# Patient Record
Sex: Female | Born: 1970
Health system: Southern US, Community
[De-identification: ages and names within clinical notes are randomized; demographics above are authoritative.]

## PROBLEM LIST (undated history)

## (undated) DIAGNOSIS — K219 Gastro-esophageal reflux disease without esophagitis: Secondary | ICD-10-CM

## (undated) DIAGNOSIS — C541 Malignant neoplasm of endometrium: Secondary | ICD-10-CM

## (undated) DIAGNOSIS — R945 Abnormal results of liver function studies: Secondary | ICD-10-CM

## (undated) DIAGNOSIS — E079 Disorder of thyroid, unspecified: Secondary | ICD-10-CM

## (undated) DIAGNOSIS — N183 Chronic kidney disease, stage 3 unspecified: Secondary | ICD-10-CM

## (undated) DIAGNOSIS — E559 Vitamin D deficiency, unspecified: Secondary | ICD-10-CM

## (undated) DIAGNOSIS — I10 Essential (primary) hypertension: Secondary | ICD-10-CM

## (undated) DIAGNOSIS — M2550 Pain in unspecified joint: Secondary | ICD-10-CM

## (undated) DIAGNOSIS — F32A Depression, unspecified: Secondary | ICD-10-CM

## (undated) DIAGNOSIS — T451X5A Adverse effect of antineoplastic and immunosuppressive drugs, initial encounter: Secondary | ICD-10-CM

## (undated) DIAGNOSIS — G620 Drug-induced polyneuropathy: Secondary | ICD-10-CM

## (undated) DIAGNOSIS — R7989 Other specified abnormal findings of blood chemistry: Secondary | ICD-10-CM

## (undated) DIAGNOSIS — F329 Major depressive disorder, single episode, unspecified: Secondary | ICD-10-CM

## (undated) DIAGNOSIS — E538 Deficiency of other specified B group vitamins: Secondary | ICD-10-CM

## (undated) DIAGNOSIS — S0300XA Dislocation of jaw, unspecified side, initial encounter: Secondary | ICD-10-CM

## (undated) HISTORY — PX: NEPHRECTOMY: SHX65

## (undated) HISTORY — PX: FRACTURE SURGERY: SHX138

## (undated) HISTORY — PX: CHOLECYSTECTOMY: SHX55

---

## 2015-11-11 ENCOUNTER — Emergency Department: Payer: 59

## 2015-11-11 ENCOUNTER — Observation Stay
Admission: EM | Admit: 2015-11-11 | Discharge: 2015-11-12 | Disposition: A | Discharge status: Home / Self Care | Payer: 59 | Attending: Internal Medicine | Admitting: Internal Medicine

## 2015-11-11 ENCOUNTER — Encounter: Payer: Self-pay | Admitting: Urgent Care

## 2015-11-11 DIAGNOSIS — J029 Acute pharyngitis, unspecified: Secondary | ICD-10-CM

## 2015-11-11 DIAGNOSIS — K219 Gastro-esophageal reflux disease without esophagitis: Secondary | ICD-10-CM | POA: Insufficient documentation

## 2015-11-11 DIAGNOSIS — I129 Hypertensive chronic kidney disease with stage 1 through stage 4 chronic kidney disease, or unspecified chronic kidney disease: Secondary | ICD-10-CM | POA: Insufficient documentation

## 2015-11-11 DIAGNOSIS — Z6841 Body Mass Index (BMI) 40.0 and over, adult: Secondary | ICD-10-CM | POA: Diagnosis not present

## 2015-11-11 DIAGNOSIS — N183 Chronic kidney disease, stage 3 (moderate): Secondary | ICD-10-CM | POA: Insufficient documentation

## 2015-11-11 DIAGNOSIS — Z79899 Other long term (current) drug therapy: Secondary | ICD-10-CM | POA: Insufficient documentation

## 2015-11-11 DIAGNOSIS — Z8542 Personal history of malignant neoplasm of other parts of uterus: Secondary | ICD-10-CM | POA: Diagnosis not present

## 2015-11-11 DIAGNOSIS — J039 Acute tonsillitis, unspecified: Principal | ICD-10-CM | POA: Insufficient documentation

## 2015-11-11 DIAGNOSIS — Z792 Long term (current) use of antibiotics: Secondary | ICD-10-CM | POA: Insufficient documentation

## 2015-11-11 DIAGNOSIS — F329 Major depressive disorder, single episode, unspecified: Secondary | ICD-10-CM | POA: Diagnosis not present

## 2015-11-11 DIAGNOSIS — E559 Vitamin D deficiency, unspecified: Secondary | ICD-10-CM | POA: Diagnosis not present

## 2015-11-11 DIAGNOSIS — E039 Hypothyroidism, unspecified: Secondary | ICD-10-CM | POA: Diagnosis not present

## 2015-11-11 DIAGNOSIS — J36 Peritonsillar abscess: Secondary | ICD-10-CM | POA: Diagnosis present

## 2015-11-11 HISTORY — DX: Drug-induced polyneuropathy: T45.1X5A

## 2015-11-11 HISTORY — DX: Other specified abnormal findings of blood chemistry: R79.89

## 2015-11-11 HISTORY — DX: Major depressive disorder, single episode, unspecified: F32.9

## 2015-11-11 HISTORY — DX: Disorder of thyroid, unspecified: E07.9

## 2015-11-11 HISTORY — DX: Essential (primary) hypertension: I10

## 2015-11-11 HISTORY — DX: Gastro-esophageal reflux disease without esophagitis: K21.9

## 2015-11-11 HISTORY — DX: Deficiency of other specified B group vitamins: E53.8

## 2015-11-11 HISTORY — DX: Pain in unspecified joint: M25.50

## 2015-11-11 HISTORY — DX: Drug-induced polyneuropathy: G62.0

## 2015-11-11 HISTORY — DX: Chronic kidney disease, stage 3 unspecified: N18.30

## 2015-11-11 HISTORY — DX: Morbid (severe) obesity due to excess calories: E66.01

## 2015-11-11 HISTORY — DX: Dislocation of jaw, unspecified side, initial encounter: S03.00XA

## 2015-11-11 HISTORY — DX: Vitamin D deficiency, unspecified: E55.9

## 2015-11-11 HISTORY — DX: Chronic kidney disease, stage 3 (moderate): N18.3

## 2015-11-11 HISTORY — DX: Malignant neoplasm of endometrium: C54.1

## 2015-11-11 HISTORY — DX: Abnormal results of liver function studies: R94.5

## 2015-11-11 HISTORY — DX: Depression, unspecified: F32.A

## 2015-11-11 LAB — BASIC METABOLIC PANEL
Anion gap: 9 (ref 5–15)
BUN: 10 mg/dL (ref 6–20)
CHLORIDE: 101 mmol/L (ref 101–111)
CO2: 29 mmol/L (ref 22–32)
Calcium: 9.2 mg/dL (ref 8.9–10.3)
Creatinine, Ser: 1.12 mg/dL — ABNORMAL HIGH (ref 0.44–1.00)
GFR calc non Af Amer: 59 mL/min — ABNORMAL LOW (ref >60)
Glucose, Bld: 112 mg/dL — ABNORMAL HIGH (ref 65–99)
POTASSIUM: 4.4 mmol/L (ref 3.5–5.1)
SODIUM: 139 mmol/L (ref 135–145)

## 2015-11-11 LAB — CBC WITH DIFFERENTIAL/PLATELET
BASOS PCT: 1 %
Basophils Absolute: 0.1 10*3/uL (ref 0–0.1)
EOS ABS: 0.3 10*3/uL (ref 0–0.7)
Eosinophils Relative: 3 %
HCT: 40.8 % (ref 35.0–47.0)
HEMOGLOBIN: 13.3 g/dL (ref 12.0–16.0)
LYMPHS ABS: 1.3 10*3/uL (ref 1.0–3.6)
Lymphocytes Relative: 16 %
MCH: 29.1 pg (ref 26.0–34.0)
MCHC: 32.6 g/dL (ref 32.0–36.0)
MCV: 89.3 fL (ref 80.0–100.0)
Monocytes Absolute: 0.7 10*3/uL (ref 0.2–0.9)
Monocytes Relative: 8 %
NEUTROS PCT: 72 %
Neutro Abs: 6.1 10*3/uL (ref 1.4–6.5)
Platelets: 249 10*3/uL (ref 150–440)
RBC: 4.57 MIL/uL (ref 3.80–5.20)
RDW: 14.6 % — ABNORMAL HIGH (ref 11.5–14.5)
WBC: 8.4 10*3/uL (ref 3.6–11.0)

## 2015-11-11 LAB — POCT RAPID STREP A: STREPTOCOCCUS, GROUP A SCREEN (DIRECT): NEGATIVE

## 2015-11-11 MED ORDER — MORPHINE SULFATE (PF) 2 MG/ML IV SOLN: 2.0000 mg | INTRAVENOUS | Status: DC | PRN

## 2015-11-11 MED ORDER — AMPICILLIN-SULBACTAM SODIUM 3 (2-1) G IJ SOLR
3.0000 g | Freq: Once | INTRAMUSCULAR | Status: AC
  Administered 2015-11-11: 3 g via INTRAVENOUS
  Filled 2015-11-11: qty 3

## 2015-11-11 MED ORDER — ALBUTEROL SULFATE (2.5 MG/3ML) 0.083% IN NEBU: 3.0000 mL | INHALATION_SOLUTION | Freq: Four times a day (QID) | RESPIRATORY_TRACT | Status: DC | PRN

## 2015-11-11 MED ORDER — LOSARTAN POTASSIUM 50 MG PO TABS
50.0000 mg | ORAL_TABLET | Freq: Every day | ORAL | Status: DC
  Administered 2015-11-11 – 2015-11-12 (×2): 50 mg via ORAL
  Filled 2015-11-11 (×2): qty 1

## 2015-11-11 MED ORDER — ACETAMINOPHEN 650 MG RE SUPP: 650.0000 mg | Freq: Four times a day (QID) | RECTAL | Status: DC | PRN

## 2015-11-11 MED ORDER — DEXAMETHASONE SODIUM PHOSPHATE 4 MG/ML IJ SOLN
4.0000 mg | Freq: Four times a day (QID) | INTRAMUSCULAR | Status: DC
  Administered 2015-11-11 – 2015-11-12 (×4): 4 mg via INTRAVENOUS
  Filled 2015-11-11 (×6): qty 1

## 2015-11-11 MED ORDER — IOPAMIDOL (ISOVUE-300) INJECTION 61%
75.0000 mL | Freq: Once | INTRAVENOUS | Status: AC | PRN
  Administered 2015-11-11: 75 mL via INTRAVENOUS

## 2015-11-11 MED ORDER — ACETAMINOPHEN 500 MG PO TABS
1000.0000 mg | ORAL_TABLET | Freq: Once | ORAL | Status: AC
  Administered 2015-11-11: 1000 mg via ORAL
  Filled 2015-11-11: qty 2

## 2015-11-11 MED ORDER — IBUPROFEN 400 MG PO TABS
400.0000 mg | ORAL_TABLET | Freq: Four times a day (QID) | ORAL | Status: DC | PRN
  Administered 2015-11-11 – 2015-11-12 (×3): 400 mg via ORAL
  Filled 2015-11-11 (×3): qty 1

## 2015-11-11 MED ORDER — LEVOTHYROXINE SODIUM 75 MCG PO TABS
75.0000 ug | ORAL_TABLET | Freq: Every day | ORAL | Status: DC
  Administered 2015-11-11 – 2015-11-12 (×2): 75 ug via ORAL
  Filled 2015-11-11 (×2): qty 1

## 2015-11-11 MED ORDER — ONDANSETRON HCL 4 MG PO TABS: 4.0000 mg | ORAL_TABLET | Freq: Four times a day (QID) | ORAL | Status: DC | PRN

## 2015-11-11 MED ORDER — BUPROPION HCL ER (XL) 150 MG PO TB24
150.0000 mg | ORAL_TABLET | Freq: Two times a day (BID) | ORAL | Status: DC
Start: 2015-11-11 — End: 2015-11-12
  Administered 2015-11-11 – 2015-11-12 (×3): 150 mg via ORAL
  Filled 2015-11-11 (×4): qty 1

## 2015-11-11 MED ORDER — SENNOSIDES-DOCUSATE SODIUM 8.6-50 MG PO TABS: 1.0000 | ORAL_TABLET | Freq: Every evening | ORAL | Status: DC | PRN

## 2015-11-11 MED ORDER — DEXAMETHASONE SODIUM PHOSPHATE 10 MG/ML IJ SOLN
10.0000 mg | Freq: Once | INTRAMUSCULAR | Status: AC
  Administered 2015-11-11: 10 mg via INTRAVENOUS
  Filled 2015-11-11: qty 1

## 2015-11-11 MED ORDER — ACETAMINOPHEN 325 MG PO TABS
650.0000 mg | ORAL_TABLET | Freq: Four times a day (QID) | ORAL | Status: DC | PRN
  Administered 2015-11-11 (×2): 650 mg via ORAL
  Filled 2015-11-11 (×2): qty 2

## 2015-11-11 MED ORDER — ONDANSETRON HCL 4 MG/2ML IJ SOLN: 4.0000 mg | Freq: Four times a day (QID) | INTRAMUSCULAR | Status: DC | PRN

## 2015-11-11 MED ORDER — SODIUM CHLORIDE 0.9 % IV SOLN
3.0000 g | Freq: Four times a day (QID) | INTRAVENOUS | Status: DC
  Administered 2015-11-11 – 2015-11-12 (×4): 3 g via INTRAVENOUS
  Filled 2015-11-11 (×8): qty 3

## 2015-11-11 MED ORDER — ENOXAPARIN SODIUM 40 MG/0.4ML ~~LOC~~ SOLN
40.0000 mg | Freq: Two times a day (BID) | SUBCUTANEOUS | Status: DC
  Filled 2015-11-11 (×2): qty 0.4

## 2015-11-11 MED ORDER — SODIUM CHLORIDE 0.9 % IV SOLN
INTRAVENOUS | Status: DC
  Administered 2015-11-11 (×2): via INTRAVENOUS

## 2015-11-11 MED ORDER — SODIUM CHLORIDE 0.9 % IV BOLUS (SEPSIS)
500.0000 mL | Freq: Once | INTRAVENOUS | Status: AC
  Administered 2015-11-11: 500 mL via INTRAVENOUS

## 2015-11-11 NOTE — Clinical Social Work Note (Signed)
CSW received consult that patient has no support. CSW visited patient to assess for social supports and offer emotional support. The patient reported that her sister has limited the family involvement since the death of their father 2 years ago. The patient relayed episodes during which her sister would deny transportation to the patient for medical needs and during which the sister would forbid other family members from assisting. The CSW provided emotional support in the form of empathy, active listening, and motivational interviewing. The CSW provided a resource list for [facility] in order to empower the patient to build community support post-dc. The patient thanked the CSW and indicated that she may need assistance with transport if a friend cannot do so at dc. The CSW advised the patient that the primary CSW would be aware and would assist at the time if needed.   Santiago Bumpers, MSW, LCSW-A [phone]

## 2015-11-11 NOTE — ED Notes (Signed)
Patient transported to CT 

## 2015-11-11 NOTE — H&P (Signed)
[place] [facility] [place] NAME: Laura Nolan    MR#:  [id number]  DATE OF BIRTH:  02-14-1970  DATE OF ADMISSION:  11/11/2015  PRIMARY CARE PHYSICIAN: No primary care provider on file.   REQUESTING/REFERRING PHYSICIAN:   CHIEF COMPLAINT:   Chief Complaint  Patient presents with  . Sore Throat  . Abscess    HISTORY OF PRESENT ILLNESS: Laura Nolan  is a 45 y.o. female with a known history of GERD, hypertension, thyroid disease, vitamin B-12 deficiency, vitamin D deficiency presented to the emergency room with sore throat since one week. Patient has pain in the throat which is a burning and aching in nature 7 out of 10 on a scale of 1-10. She also had a low-grade fever couple of days ago. Has difficulty swallowing solid food secondary to pain in the throat. No history of travel or sick contacts at home. Patient had a tonsillar inflammation number of times and she was a child. Patient was evaluated in the emergency room she was found to have a tonsillar enlargement and tonsillar abscess on CT scan of the neck. Case was discussed with ENT physician on-call who recommended inpatient hospitalization and IV antibiotics. No complaints of any chest pain. No history of any shortness of breath. Hospitalist service was consulted for further care of the patient.  PAST MEDICAL HISTORY:   Past Medical History:  Diagnosis Date  . Arthralgia   . Chronic renal disease, stage 3, moderately decreased glomerular filtration rate (GFR) between 30-59 mL/min/1.73 square meter   . Depression   . Elevated LFTs   . Endometrial cancer ([place])   . Endometrial cancer ([place])   . GERD (gastroesophageal reflux disease)   . Hypertension   . Morbid obesity ([place])   . Neuropathy due to chemotherapeutic drug ([place])   . Thyroid disease   . TMJ (dislocation of temporomandibular joint)   . Vitamin B12 deficiency   . Vitamin D deficiency     PAST SURGICAL HISTORY: Past Surgical  History:  Procedure Laterality Date  . CHOLECYSTECTOMY    . FRACTURE SURGERY    . NEPHRECTOMY Right     SOCIAL HISTORY:  Social History  Substance Use Topics  . Smoking status: Never Smoker  . Smokeless tobacco: Never Used  . Alcohol use Yes    FAMILY HISTORY:  Family History  Problem Relation Age of Onset  . Hypertension Mother   . Diabetes Father   . Hypertension Father     DRUG ALLERGIES:  Allergies  Allergen Reactions  . Lisinopril Cough  . Macrodantin [Nitrofurantoin Macrocrystal] Itching  . Sulfa Antibiotics Itching and Swelling    REVIEW OF SYSTEMS:   CONSTITUTIONAL: Had fever No fatigue or weakness.  EYES: No blurred or double vision.  EARS, NOSE, AND THROAT: No tinnitus or ear pain.  Has throat pain. RESPIRATORY: No cough, shortness of breath, wheezing or hemoptysis.  CARDIOVASCULAR: No chest pain, orthopnea, edema.  GASTROINTESTINAL: No nausea, vomiting, diarrhea or abdominal pain.  GENITOURINARY: No dysuria, hematuria.  ENDOCRINE: No polyuria, nocturia,  HEMATOLOGY: No anemia, easy bruising or bleeding SKIN: No rash or lesion. MUSCULOSKELETAL: No joint pain or arthritis.   NEUROLOGIC: No tingling, numbness, weakness.  PSYCHIATRY: No anxiety or depression.   MEDICATIONS AT HOME:  Prior to Admission medications   Medication Sig Start Date End Date Taking? Authorizing Provider  acetaminophen (TYLENOL) 500 MG tablet Take 1,000 mg by mouth every 6 (six) hours as needed for mild pain.  Yes Historical Provider, MD  albuterol (PROVENTIL HFA;VENTOLIN HFA) 108 (90 Base) MCG/ACT inhaler Inhale 2 puffs into the lungs every 6 (six) hours as needed for wheezing or shortness of breath.   Yes Historical Provider, MD  buPROPion (WELLBUTRIN XL) 150 MG 24 hr tablet Take 150 mg by mouth 2 (two) times daily.   Yes Historical Provider, MD  clindamycin (CLEOCIN) 300 MG capsule Take 300 mg by mouth 4 (four) times daily.   Yes Historical Provider, MD  diphenhydrAMINE  (BENADRYL) 25 MG tablet Take 25 mg by mouth every 6 (six) hours as needed for allergies.   Yes Historical Provider, MD  ergocalciferol (VITAMIN D2) 50000 units capsule Take 50,000 Units by mouth once a week. Patient takes on Wednesday.   Yes Historical Provider, MD  levothyroxine (SYNTHROID, LEVOTHROID) 75 MCG tablet Take 75 mcg by mouth daily before breakfast.   Yes Historical Provider, MD  loratadine (CLARITIN) 10 MG tablet Take 10 mg by mouth daily.   Yes Historical Provider, MD  LORazepam (ATIVAN) 1 MG tablet Take 1 mg by mouth 2 (two) times daily as needed for anxiety (nausea).   Yes Historical Provider, MD  losartan (COZAAR) 50 MG tablet Take 50 mg by mouth daily.   Yes Historical Provider, MD  montelukast (SINGULAIR) 10 MG tablet Take 10 mg by mouth at bedtime.   Yes Historical Provider, MD  pantoprazole (PROTONIX) 40 MG tablet Take 40 mg by mouth 2 (two) times daily.   Yes Historical Provider, MD      PHYSICAL EXAMINATION:   VITAL SIGNS: Blood pressure 126/67, pulse 80, temperature 98.3 F (36.8 C), temperature source Oral, resp. rate 20, height 5\' 6"  (1.676 m), weight (!) 213.2 kg (470 lb), SpO2 100 %.  GENERAL:  45 y.o.-year-old obese femalepatient lying in the bed with no acute distress.  EYES: Pupils equal, round, reactive to light and accommodation. No scleral icterus. Extraocular muscles intact.  HEENT: Head atraumatic, normocephalic. Oropharynx inflamed and erythematous Whitish exudate over tonsils Tonsils enlarged NECK:  Supple, no jugular venous distention. No thyroid enlargement, no tenderness.  LUNGS: Normal breath sounds bilaterally, no wheezing, rales,rhonchi or crepitation. No use of accessory muscles of respiration.  CARDIOVASCULAR: S1, S2 normal. No murmurs, rubs, or gallops.  ABDOMEN: Soft, obese, nontender, nondistended. Bowel sounds present. No organomegaly or mass.  EXTREMITIES: No pedal edema, cyanosis, or clubbing.  NEUROLOGIC: Cranial nerves II through XII are  intact. Muscle strength 5/5 in all extremities. Sensation intact. Gait not checked.  PSYCHIATRIC: The patient is alert and oriented x 3.  SKIN: No obvious rash, lesion, or ulcer.   LABORATORY PANEL:   CBC  Recent Labs Lab 11/11/15 0301  WBC 8.4  HGB 13.3  HCT 40.8  PLT 249  MCV 89.3  MCH 29.1  MCHC 32.6  RDW 14.6*  LYMPHSABS 1.3  MONOABS 0.7  EOSABS 0.3  BASOSABS 0.1   ------------------------------------------------------------------------------------------------------------------  Chemistries   Recent Labs Lab 11/11/15 0301  NA 139  K 4.4  CL 101  CO2 29  GLUCOSE 112*  BUN 10  CREATININE 1.12*  CALCIUM 9.2   ------------------------------------------------------------------------------------------------------------------ estimated creatinine clearance is 122.3 mL/min (by C-G formula based on SCr of 1.12 mg/dL (H)). ------------------------------------------------------------------------------------------------------------------ No results for input(s): TSH, T4TOTAL, T3FREE, THYROIDAB in the last 72 hours.  Invalid input(s): FREET3   Coagulation profile No results for input(s): INR, PROTIME in the last 168 hours. ------------------------------------------------------------------------------------------------------------------- No results for input(s): DDIMER in the last 72 hours. -------------------------------------------------------------------------------------------------------------------  Cardiac Enzymes No results for input(s):  CKMB, TROPONINI, MYOGLOBIN in the last 168 hours.  Invalid input(s): CK ------------------------------------------------------------------------------------------------------------------ Invalid input(s): POCBNP  ---------------------------------------------------------------------------------------------------------------  Urinalysis No results found for: COLORURINE, APPEARANCEUR, LABSPEC, PHURINE, GLUCOSEU, HGBUR,  BILIRUBINUR, KETONESUR, PROTEINUR, UROBILINOGEN, NITRITE, LEUKOCYTESUR   RADIOLOGY: Ct Soft Tissue Neck W Contrast  Result Date: 11/11/2015 CLINICAL DATA:  45 y/o F; pharyngitis with draining tonsils and concern for abscess. EXAM: CT NECK WITH CONTRAST TECHNIQUE: Multidetector CT imaging of the neck was performed using the standard protocol following the bolus administration of intravenous contrast. CONTRAST:  61mL ISOVUE-300 IOPAMIDOL (ISOVUE-300) INJECTION 61% COMPARISON:  None. FINDINGS: Pharynx and larynx: Massive enlargement of palatini tonsils contacting in the midline with multiple areas of hypoattenuation compatible with phlegmonous changes. There is a more discrete fluid collection in the left superior palatini tonsil measuring 11 x 11 x 7 mm (AP x ML x CC) (series 7, image 56 and series 6, image 56) which may represent a developing abscess. Inflammatory changes of the adjacent parapharyngeal fat is likely reactive. No prevertebral/retropharyngeal fluid collection. Salivary glands: No inflammation, mass, or stone. Thyroid: Normal. Lymph nodes: Bilateral upper cervical lymphadenopathy is likely reactive. The largest lymph node is in the left upper cervical chain and measures 27 x 25 mm axially. No lymph node necrosis. Vascular: Negative. Limited intracranial: Negative. Visualized orbits: Negative. Mastoids and visualized paranasal sinuses: Clear. Skeleton: Degenerative changes of the cervical spine with a prominent disc osteophyte complex at the C5-6 level with at least moderate canal stenosis. Upper chest: Negative. Other: None. IMPRESSION: 1. Massive palatini tonsil enlargement with multiple ill-defined areas of hypoattenuation compatible with phlegmonous changes. 2. More discrete left upper palatini tonsil fluid collection measuring up to 11 mm may represent an early abscess. 3. Mild reactive inflammatory changes of parapharyngeal fat and left-greater-than-right upper cervical lymphadenopathy. 4.  Degenerative changes of cervical spine with prominent disc osteophyte complex at C5-6 level with at least moderate canal stenosis. Electronically Signed   By: Kristine Garbe M.D.   On: 11/11/2015 04:14    EKG: No orders found for this or any previous visit.  IMPRESSION AND PLAN: 45 year old female patient with history of obesity, hypertension, endometrial cancer, GERD presented to the emergency room with a sore throat of one week duration. Admitting diagnosis 1. Tonsillar abscess 2. Tonsillitis 3. Sore throat secondary to tonsillar inflammation 4. Hypertension 5. GERD Treatment plan Admit patient to medical floor observation bed Pain management Clear liquid diet IV Unasyn antibiotic ENT consultation Supportive care.  All the records are reviewed and case discussed with ED provider. Management plans discussed with the patient, family and they are in agreement.  CODE STATUS:FULL Code Status History    This patient does not have a recorded code status. Please follow your organizational policy for patients in this situation.       TOTAL TIME TAKING CARE OF THIS PATIENT: 50 minutes.    Saundra Shelling M.D on 11/11/2015 at 5:58 AM  Between 7am to 6pm - Pager - [phone]  After 6pm go to [email or web address] - password [email or web address] [facility]  [place] [facility]  Office  [phone]  CC: Primary care physician; No primary care provider on file.

## 2015-11-11 NOTE — ED Notes (Signed)
Patient to stat desk via wheelchair by EMS.  Patient currently being treated for strep throat.  States worse now with drainage and tonsils touching.  EMS reports vital signs within normal limits.

## 2015-11-11 NOTE — ED Provider Notes (Signed)
[facility] Emergency Department Provider Note   ____________________________________________   First MD Initiated Contact with Patient 11/11/15 [phone]     (approximate)  I have reviewed the triage vital signs and the nursing notes.   HISTORY  Chief Complaint Sore Throat   HPI Laura Nolan is a 45 y.o. female with history of chronic kidney disease, hypertension and obesity who presents for evaluation of one week of sore throat, gradual onset, constant, severe, no modifying factors are she was seen by her primary care doctor on day 3 of illness and given an IM injection of penicillin. She reports her symptoms were not improving and 2 days ago she saw her primary care doctor who started her on by mouth clindamycin and recommended she present to the emergency department if her symptoms were worsening due to concern for developing abscess. Tonight she feels as if her swelling in her throat is worse. She was never tested for strep throat. She has had fevers him a chills. No chest pain or difficulty breathing. She had one episode of nonbloody nonbilious emesis but denies any diarrhea or abdominal pain.   Past Medical History:  Diagnosis Date  . Arthralgia   . Chronic renal disease, stage 3, moderately decreased glomerular filtration rate (GFR) between 30-59 mL/min/1.73 square meter   . Depression   . Elevated LFTs   . Endometrial cancer ([place])   . Endometrial cancer ([place])   . GERD (gastroesophageal reflux disease)   . Hypertension   . Morbid obesity ([place])   . Neuropathy due to chemotherapeutic drug ([place])   . Thyroid disease   . TMJ (dislocation of temporomandibular joint)   . Vitamin B12 deficiency   . Vitamin D deficiency     There are no active problems to display for this patient.   Past Surgical History:  Procedure Laterality Date  . CHOLECYSTECTOMY    . FRACTURE SURGERY    . NEPHRECTOMY Right     Prior to Admission medications   Not on File     Allergies Lisinopril; Macrodantin [nitrofurantoin macrocrystal]; and Sulfa antibiotics  No family history on file.  Social History Social History  Substance Use Topics  . Smoking status: Never Smoker  . Smokeless tobacco: Never Used  . Alcohol use Yes    Review of Systems Constitutional: + fever/chills Eyes: No visual changes. ENT: + sore throat. Cardiovascular: Denies chest pain. Respiratory: Denies shortness of breath. Gastrointestinal: No abdominal pain.  No nausea, no vomiting.  No diarrhea.  No constipation. Genitourinary: Negative for dysuria. Musculoskeletal: Negative for back pain. Skin: Negative for rash. Neurological: Negative for headaches, focal weakness or numbness.  10-point ROS otherwise negative.  ____________________________________________   PHYSICAL EXAM:  VITAL SIGNS: ED Triage Vitals  Enc Vitals Group     BP 11/11/15 0224 128/62     Pulse Rate 11/11/15 0224 86     Resp 11/11/15 0224 18     Temp 11/11/15 0224 98.3 F (36.8 C)     Temp Source 11/11/15 0224 Oral     SpO2 11/11/15 0224 99 %     Weight 11/11/15 0225 (!) 470 lb (213.2 kg)     Height 11/11/15 0225 5\' 6"  (1.676 m)     Head Circumference --      Peak Flow --      Pain Score 11/11/15 0225 8     Pain Loc --      Pain Edu? --      Excl. in [place]? --  Constitutional: Alert and oriented. Well appearing and in no acute distress. Eyes: Conjunctivae are normal. PERRL. EOMI. Head: Atraumatic. Nose: No congestion/rhinnorhea. Mouth/Throat: Mucous membranes are moist.  Oropharynx Is erythematous, there is white exudate on the tonsils bilaterally and the tonsils are enlarged bilaterally, left greater than right area of the uvula is midline. Neck: No stridor.Supple without meningismus.  Cardiovascular: Normal rate, regular rhythm. Grossly normal heart sounds.  Good peripheral circulation. Respiratory: Normal respiratory effort.  No retractions. Lungs CTAB. Gastrointestinal: Soft and  nontender. No distention. No abdominal bruits. No CVA tenderness. Genitourinary: deferred Musculoskeletal: No lower extremity tenderness nor edema.  No joint effusions. Neurologic:  Normal speech and language. No gross focal neurologic deficits are appreciated. No gait instability. Skin:  Skin is warm, dry and intact. No rash noted. Psychiatric: Mood and affect are normal. Speech and behavior are normal.  ____________________________________________   LABS (all labs ordered are listed, but only abnormal results are displayed)  Labs Reviewed  CBC WITH DIFFERENTIAL/PLATELET - Abnormal; Notable for the following:       Result Value   RDW 14.6 (*)    All other components within normal limits  BASIC METABOLIC PANEL - Abnormal; Notable for the following:    Glucose, Bld 112 (*)    Creatinine, Ser 1.12 (*)    GFR calc non Af Amer 59 (*)    All other components within normal limits  CULTURE, GROUP A STREP ([place])  CULTURE, BLOOD (ROUTINE X 2)  CULTURE, BLOOD (ROUTINE X 2)  POCT RAPID STREP A   ____________________________________________  EKG  none ____________________________________________  RADIOLOGY  CT soft tissue neck  IMPRESSION: 1. Massive palatini tonsil enlargement with multiple ill-defined areas of hypoattenuation compatible with phlegmonous changes. 2. More discrete left upper palatini tonsil fluid collection measuring up to 11 mm may represent an early abscess. 3. Mild reactive inflammatory changes of parapharyngeal fat and left-greater-than-right upper cervical lymphadenopathy. 4. Degenerative changes of cervical spine with prominent disc osteophyte complex at C5-6 level with at least moderate canal stenosis. ____________________________________________   PROCEDURES  Procedure(s) performed: None  Procedures  Critical Care performed: No  ____________________________________________   INITIAL IMPRESSION / ASSESSMENT AND PLAN / ED COURSE  Pertinent  labs & imaging results that were available during my care of the patient were reviewed by me and considered in my medical decision making (see chart for details).  Laura Nolan is a 45 y.o. female with history of chronic kidney disease, hypertension and obesity who presents for evaluation of one week of sore throat. On exam, she is nontoxic appearing and in no acute distress. Vital signs are stable and she is afebrile. She does have bilateral tonsillar enlargement as well as tonsillar exudate. She is handling secretions, there is no evidence of respiratory distress or impending airway compromise. We'll obtain screening labs, CT soft tissue neck to evaluate for peritonsillar abscess. Reassess for disposition.  ----------------------------------------- 5:16 AM on 11/11/2015 ----------------------------------------- CBC generally unremarkable. BMP shows mild creatinine elevation of 1.12. She put a care strep negative, culture sent. CT soft tissue neck shows supple area of phlegmonous change in the palatine tonsils as well as an 11 mm fluid collection in the left tonsil which could represent an early peritonsillar abscess. I discussed this with Dr. Malon Kindle of ENT who recommended hospitalist admission for IV antibiotics and steroids. He does not recommend attempt at drainage at this time. I have ordered IV Unasyn as well as Decadron. Case discussed with hospitalist for admission at this time.  Clinical Course     ____________________________________________   FINAL CLINICAL IMPRESSION(S) / ED DIAGNOSES  Final diagnoses:  Sore throat  Tonsillitis with exudate  Peritonsillar abscess      NEW MEDICATIONS STARTED DURING THIS VISIT:  New Prescriptions   No medications on file     Note:  This document was prepared using Dragon voice recognition software and may include unintentional dictation errors.    Joanne Gavel, MD 11/11/15 [phone]

## 2015-11-11 NOTE — Progress Notes (Signed)
Pharmacy Antibiotic Note  Jaelah Liberato is a 45 y.o. female admitted on 11/11/2015 with tonsillar abscess.  Pharmacy has been consulted for Unasyn dosing.  Plan: Unasyn 3 gm IV Q6H  Height: 5\' 6"  (167.6 cm) Weight: (!) 470 lb (213.2 kg) IBW/kg (Calculated) : 59.3  Temp (24hrs), Avg:98.3 F (36.8 C), Min:98.3 F (36.8 C), Max:98.3 F (36.8 C)   Recent Labs Lab 11/11/15 0301  WBC 8.4  CREATININE 1.12*    Estimated Creatinine Clearance: 122.3 mL/min (by C-G formula based on SCr of 1.12 mg/dL (H)).    Allergies  Allergen Reactions  . Lisinopril Cough  . Macrodantin [Nitrofurantoin Macrocrystal] Itching  . Sulfa Antibiotics Itching and Swelling    Thank you for allowing pharmacy to be a part of this patient's care.  Laural Benes, Pharm.D., BCPS Clinical Pharmacist 11/11/2015 5:59 AM

## 2015-11-11 NOTE — ED Triage Notes (Addendum)
Patient presents with reports of strep throat. Pain to posterior pharynx x 1 week - Dx'd with strep pharyngitis and put on PCN. Patient has been seen a second time by PCP and put on Clindamycin. Patient presents today reporting that her tonsils are "enlarged and abscessed"; reports that they are touching. Patient was advised on a PTA on Friday; advised that she will need to see ENT for a possible drainage of the abscess. Patient reports that she has some difficulty breathing tonight while at home.

## 2015-11-11 NOTE — Consult Note (Signed)
Laura Nolan, Laura Nolan [id number] Jun 15, 1970 Laura Nearing, Laura  Reason for Consult: Tonsillitis, possible peritonsillar abscess Requesting Physician: Laura Costa, Laura Consulting Physician: Laura Nearing, Laura  HPI: This 45 y.o. year old female was admitted on 11/11/2015 for Peritonsillar abscess [J36] Sore throat [J02.9] Tonsillitis with exudate [J03.90]. Patient with a history of GERD, hypertension, thyroid disease, vitamin B-12 deficiency, vitamin D deficiency presented to the emergency room with sore throat for one week.  She had been given a shot of penicillin earlier in the week and started on clindamycin on Friday. She had some issues with swallowing the clindamycin due to nausea and increasing sore throat, and did not get all of her doses in on Saturday. With symptoms worsening she presented to the emergency room. She also had a low-grade fever couple of days ago. Has difficulty swallowing solid food secondary to pain in the throat. No history of travel or sick contacts at home. Patient had a tonsillitis and strep as a child but says those problems improved after she had one of her kidneys removed. She has not had frequent tonsillitis as an adult however. Patient was evaluated in the emergency room she was found to have a tonsillar enlargement and a possible developing small left sided (11 x 7 mm) tonsillar abscess on CT scan of the neck.  For the most part, phlegmonous changes were noted in the tonsil. She was admitted for inpatient hospitalization and IV antibiotics. No complaints of any chest pain. No history of any shortness of breath.  the patient notes that she is now able to swallow and certainly her pain is improving since she was started on antibiotics and steroids.  Medications:  Current Facility-Administered Medications  Medication Dose Route Frequency Provider Last Rate Last Dose  . 0.9 %  sodium chloride infusion   Intravenous Continuous Laura Shelling, Laura 100 mL/hr at 11/11/15 0647    .  acetaminophen (TYLENOL) tablet 650 mg  650 mg Oral Q6H PRN Laura Shelling, Laura       Or  . acetaminophen (TYLENOL) suppository 650 mg  650 mg Rectal Q6H PRN Laura Pyreddy, Laura      . albuterol (PROVENTIL) (2.5 MG/3ML) 0.083% nebulizer solution 3 mL  3 mL Inhalation Q6H PRN Laura Pyreddy, Laura      . Ampicillin-Sulbactam (UNASYN) 3 g in sodium chloride 0.9 % 100 mL IVPB  3 g Intravenous Q6H Laura Pyreddy, Laura      . buPROPion (WELLBUTRIN XL) 24 hr tablet 150 mg  150 mg Oral BID Laura Shelling, Laura   150 mg at 11/11/15 1116  . dexamethasone (DECADRON) injection 4 mg  4 mg Intravenous Q6H Laura Pyreddy, Laura      . enoxaparin (LOVENOX) injection 40 mg  40 mg Subcutaneous BID Laura Pyreddy, Laura      . ibuprofen (ADVIL,MOTRIN) tablet 400 mg  400 mg Oral Q6H PRN Laura Costa, Laura   400 mg at 11/11/15 1116  . levothyroxine (SYNTHROID, LEVOTHROID) tablet 75 mcg  75 mcg Oral QAC breakfast Laura Shelling, Laura   75 mcg at 11/11/15 0821  . losartan (COZAAR) tablet 50 mg  50 mg Oral Daily Laura Shelling, Laura   50 mg at 11/11/15 1115  . morphine 2 MG/ML injection 2 mg  2 mg Intravenous Q4H PRN Laura Pyreddy, Laura      . ondansetron (ZOFRAN) tablet 4 mg  4 mg Oral Q6H PRN Laura Shelling, Laura       Or  . ondansetron (ZOFRAN) injection 4 mg  4 mg Intravenous Q6H PRN Laura Pyreddy, Laura      . senna-docusate (Senokot-S) tablet 1 tablet  1 tablet Oral QHS PRN Laura Shelling, Laura      .  Medications Prior to Admission  Medication Sig Dispense Refill  . acetaminophen (TYLENOL) 500 MG tablet Take 1,000 mg by mouth every 6 (six) hours as needed for mild pain.    Marland Kitchen albuterol (PROVENTIL HFA;VENTOLIN HFA) 108 (90 Base) MCG/ACT inhaler Inhale 2 puffs into the lungs every 6 (six) hours as needed for wheezing or shortness of breath.    Marland Kitchen buPROPion (WELLBUTRIN XL) 150 MG 24 hr tablet Take 150 mg by mouth 2 (two) times daily.    . clindamycin (CLEOCIN) 300 MG capsule Take 300 mg by mouth 4 (four) times daily.    . diphenhydrAMINE (BENADRYL) 25 MG  tablet Take 25 mg by mouth every 6 (six) hours as needed for allergies.    Marland Kitchen ergocalciferol (VITAMIN D2) 50000 units capsule Take 50,000 Units by mouth once a week. Patient takes on Wednesday.    . levothyroxine (SYNTHROID, LEVOTHROID) 75 MCG tablet Take 75 mcg by mouth daily before breakfast.    . loratadine (CLARITIN) 10 MG tablet Take 10 mg by mouth daily.    Marland Kitchen LORazepam (ATIVAN) 1 MG tablet Take 1 mg by mouth 2 (two) times daily as needed for anxiety (nausea).    . losartan (COZAAR) 50 MG tablet Take 50 mg by mouth daily.    . montelukast (SINGULAIR) 10 MG tablet Take 10 mg by mouth at bedtime.    . pantoprazole (PROTONIX) 40 MG tablet Take 40 mg by mouth 2 (two) times daily.      Allergies:  Allergies  Allergen Reactions  . Lisinopril Cough  . Macrodantin [Nitrofurantoin Macrocrystal] Itching  . Sulfa Antibiotics Itching and Swelling    PMH:  Past Medical History:  Diagnosis Date  . Arthralgia   . Chronic renal disease, stage 3, moderately decreased glomerular filtration rate (GFR) between 30-59 mL/min/1.73 square meter   . Depression   . Elevated LFTs   . Endometrial cancer ([place])   . Endometrial cancer ([place])   . GERD (gastroesophageal reflux disease)   . Hypertension   . Morbid obesity ([place])   . Neuropathy due to chemotherapeutic drug ([place])   . Thyroid disease   . TMJ (dislocation of temporomandibular joint)   . Vitamin B12 deficiency   . Vitamin D deficiency     Fam Hx:  Family History  Problem Relation Age of Onset  . Hypertension Mother   . Diabetes Father   . Hypertension Father     Soc Hx:  Social History   Social History  . Marital status: Single    Spouse name: N/A  . Number of children: N/A  . Years of education: N/A   Occupational History  . works for [employer]  . Smoking status: Never Smoker  . Smokeless tobacco: Never Used  . Alcohol use Yes  . Drug use: Unknown  . Sexual activity: Not on file    Other Topics Concern  . Not on file   Social History Narrative  . No narrative on file    PSH:  Past Surgical History:  Procedure Laterality Date  . CHOLECYSTECTOMY    . FRACTURE SURGERY    . NEPHRECTOMY Right   . Procedures since admission: No admission procedures for hospital encounter.  ROS: Review of systems normal other than 12  systems except per HPI.  PHYSICAL EXAM  Vitals: Blood pressure 132/70, pulse 89, temperature 99.3 F (37.4 C), temperature source Oral, resp. rate 20, height _0  (1.676 m), weight (!) 213.2 kg (470 lb), SpO2 99 %.. General: Well-developed, Well-nourished in no acute distress Mood: Mood and affect well adjusted, pleasant and cooperative. Orientation: Grossly alert and oriented. Vocal Quality: No hoarseness. Communicates verbally.No "hot potato voice" head and Face: NCAT. No facial asymmetry. No visible skin lesions. No significant facial scars. No tenderness with sinus percussion. Facial strength normal and symmetric. Ears: External ears with normal landmarks, no lesions. External auditory canals free of infection, cerumen impaction or lesions. Tympanic membranes intact with good landmarks and normal mobility on pneumatic otoscopy. No middle ear effusion. Hearing: Speech reception grossly normal. Nose: External nose normal with midline dorsum and no lesions or deformity. Nasal Cavity reveals essentially midline septum with normal inferior turbinates. No significant mucosal congestion or erythema. Nasal secretions are minimal and clear. No polyps seen on anterior rhinoscopy. Oral Cavity/ Oropharynx: Lips are normal with no lesions. Teeth no frank dental caries. Gingiva healthy with no lesions or gingivitis. Oropharynx reveals exudative tonsillitis, worse on the left. The right tonsil is 3+ and cryptic, the left is 4+ and cryptic with exudate and ulceration. There is no evidence of significant swelling extending to the soft palate however and no significant  uvular deviation.  Indirect Laryngoscopy/Nasopharyngoscopy: Visualization of the larynx, hypopharynx and nasopharynx is not possible in this setting with routine examination. Neck: Supple and symmetric with tender jugulodigastric lymphadenopathy, worse on the left where there is a 2-1/2 cm soft nonfluctuant lymph node noted. The trachea is midline. Thyroid gland is soft, nontender and symmetric with no masses or enlargement. Parotid and submandibular glands are soft, nontender and symmetric, without masses. Lymphatic: Cervical lymph nodes are without palpable lymphadenopathy or tenderness. Respiratory: Normal respiratory effort without labored breathing. Cardiovascular: Carotid pulse shows regular rate and rhythm Neurologic: Cranial Nerves II through XII are grossly intact. Eyes: Gaze and Ocular Motility are grossly normal. PERRLA. No visible nystagmus.  MEDICAL DECISION MAKING: Data Review:  Results for orders placed or performed during the hospital encounter of 11/11/15 (from the past 48 hour(s))  CBC with Differential     Status: Abnormal   Collection Time: 11/11/15  3:01 AM  Result Value Ref Range   WBC 8.4 3.6 - 11.0 K/uL   RBC 4.57 3.80 - 5.20 MIL/uL   Hemoglobin 13.3 12.0 - 16.0 g/dL   HCT 40.8 35.0 - 47.0 %   MCV 89.3 80.0 - 100.0 fL   MCH 29.1 26.0 - 34.0 pg   MCHC 32.6 32.0 - 36.0 g/dL   RDW 14.6 (H) 11.5 - 14.5 %   Platelets 249 150 - 440 K/uL   Neutrophils Relative % 72 %   Neutro Abs 6.1 1.4 - 6.5 K/uL   Lymphocytes Relative 16 %   Lymphs Abs 1.3 1.0 - 3.6 K/uL   Monocytes Relative 8 %   Monocytes Absolute 0.7 0.2 - 0.9 K/uL   Eosinophils Relative 3 %   Eosinophils Absolute 0.3 0 - 0.7 K/uL   Basophils Relative 1 %   Basophils Absolute 0.1 0 - 0.1 K/uL  Basic metabolic panel     Status: Abnormal   Collection Time: 11/11/15  3:01 AM  Result Value Ref Range   Sodium 139 135 - 145 mmol/L   Potassium 4.4 3.5 - 5.1 mmol/L   Chloride 101 101 - 111 mmol/L   CO2 29  22 -  32 mmol/L   Glucose, Bld 112 (H) 65 - 99 mg/dL   BUN 10 6 - 20 mg/dL   Creatinine, Ser 1.12 (H) 0.44 - 1.00 mg/dL   Calcium 9.2 8.9 - 10.3 mg/dL   GFR calc non Af Amer 59 (L) >60 mL/min   GFR calc Af Amer >60 >60 mL/min    Comment: (NOTE) The eGFR has been calculated using the CKD EPI equation. This calculation has not been validated in all clinical situations. eGFR's persistently <60 mL/min signify possible Chronic Kidney Disease.    Anion gap 9 5 - 15  POCT rapid strep A [facility])     Status: None   Collection Time: 11/11/15  3:12 AM  Result Value Ref Range   Streptococcus, Group A Screen (Direct) NEGATIVE NEGATIVE  Blood culture (routine x 2)     Status: None (Preliminary result)   Collection Time: 11/11/15  5:20 AM  Result Value Ref Range   Specimen Description BLOOD LEFT ARM    Special Requests BOTTLES DRAWN AEROBIC AND ANAEROBIC 5CC    Culture NO GROWTH < 12 HOURS    Report Status PENDING   Blood culture (routine x 2)     Status: None (Preliminary result)   Collection Time: 11/11/15  5:20 AM  Result Value Ref Range   Specimen Description BLOOD RIGHT ARM    Special Requests BOTTLES DRAWN AEROBIC AND ANAEROBIC 5CC    Culture NO GROWTH < 12 HOURS    Report Status PENDING   . Ct Soft Tissue Neck W Contrast  Result Date: 11/11/2015 CLINICAL DATA:  45 y/o F; pharyngitis with draining tonsils and concern for abscess. EXAM: CT NECK WITH CONTRAST TECHNIQUE: Multidetector CT imaging of the neck was performed using the standard protocol following the bolus administration of intravenous contrast. CONTRAST:  14m ISOVUE-300 IOPAMIDOL (ISOVUE-300) INJECTION 61% COMPARISON:  None. FINDINGS: Pharynx and larynx: Massive enlargement of palatini tonsils contacting in the midline with multiple areas of hypoattenuation compatible with phlegmonous changes. There is a more discrete fluid collection in the left superior palatini tonsil measuring 11 x 11 x 7 mm (AP x ML x CC) (series 7, image  56 and series 6, image 56) which may represent a developing abscess. Inflammatory changes of the adjacent parapharyngeal fat is likely reactive. No prevertebral/retropharyngeal fluid collection. Salivary glands: No inflammation, mass, or stone. Thyroid: Normal. Lymph nodes: Bilateral upper cervical lymphadenopathy is likely reactive. The largest lymph node is in the left upper cervical chain and measures 27 x 25 mm axially. No lymph node necrosis. Vascular: Negative. Limited intracranial: Negative. Visualized orbits: Negative. Mastoids and visualized paranasal sinuses: Clear. Skeleton: Degenerative changes of the cervical spine with a prominent disc osteophyte complex at the C5-6 level with at least moderate canal stenosis. Upper chest: Negative. Other: None. IMPRESSION: 1. Massive palatini tonsil enlargement with multiple ill-defined areas of hypoattenuation compatible with phlegmonous changes. 2. More discrete left upper palatini tonsil fluid collection measuring up to 11 mm may represent an early abscess. 3. Mild reactive inflammatory changes of parapharyngeal fat and left-greater-than-right upper cervical lymphadenopathy. 4. Degenerative changes of cervical spine with prominent disc osteophyte complex at C5-6 level with at least moderate canal stenosis. Electronically Signed   By: LKristine GarbeM.D.   On: 11/11/2015 04:14  .   ASSESSMENT: Acute tonsillitis with peritonsillar cellulitis. While a small abscess cannot be absolutely excluded, she seems to be improving appropriately with antibiotics and steroids, so I suspect the small hypodensity at  the superior pole of the tonsil is more likely phlegmonous change which will respond to medical management and not require drainage.  PLAN: Recommend continued IV Unasyn and Decadron. If the patient shows continued improvement over the next 24 hours, she could potentially be discharged home on oral antibiotics and a prednisone taper. The previously  prescribed clindamycin would be appropriate. It sounds like the biggest issue was taking the clindamycin previously was the lack of any ability to take the medication with food. The patient inquired about tonsillectomy. I certainly would not want to perform an emergent tonsillectomy due to the higher risk, particularly with the risk of high blood loss as well as airway management concerns in a patient with obesity and significantly swollen tonsils. General anesthesia would only be considered if she was not responding to appropriate medical management, which she seems to be doing currently. Elective tonsillectomy after treatment would only be considered if she were having frequent tonsillitis in recent years, which is not the case.   Laura Nearing, Laura 11/11/2015 12:51 PM

## 2015-11-11 NOTE — Progress Notes (Signed)
Laura Nolan at Laura Nolan NAME: Laura Nolan    MR#:  [id number]  DATE OF BIRTH:  09-23-1970  SUBJECTIVE:   Patient here with sore throat and found to have tonsillitis  REVIEW OF SYSTEMS:    Review of Systems  HENT: Positive for sore throat.     Tolerating Diet: yes      DRUG ALLERGIES:   Allergies  Allergen Reactions  . Lisinopril Cough  . Macrodantin [Nitrofurantoin Macrocrystal] Itching  . Sulfa Antibiotics Itching and Swelling    VITALS:  Blood pressure 132/70, pulse 89, temperature 99.3 F (37.4 C), temperature source Oral, resp. rate 20, height 5\' 6"  (1.676 m), weight (!) 213.2 kg (470 lb), SpO2 99 %.  PHYSICAL EXAMINATION:   Physical Exam  Constitutional: She is oriented to person, place, and time and well-developed, well-nourished, and in no distress. No distress.  HENT:  Head: Normocephalic.  Neck swelling  Eyes: No scleral icterus.  Neck: Normal range of motion. Neck supple. No JVD present. No tracheal deviation present.  Cardiovascular: Normal rate, regular rhythm and normal heart sounds.  Exam reveals no gallop and no friction rub.   No murmur heard. Pulmonary/Chest: Effort normal and breath sounds normal. No respiratory distress. She has no wheezes. She has no rales. She exhibits no tenderness.  Abdominal: Soft. Bowel sounds are normal. She exhibits no distension and no mass. There is no tenderness. There is no rebound and no guarding.  Musculoskeletal: Normal range of motion. She exhibits no edema.  Neurological: She is alert and oriented to person, place, and time.  Skin: Skin is warm. No rash noted. No erythema.  Psychiatric: Affect and judgment normal.      LABORATORY PANEL:   CBC  Recent Labs Lab 11/11/15 0301  WBC 8.4  HGB 13.3  HCT 40.8  PLT 249   ------------------------------------------------------------------------------------------------------------------  Chemistries   Recent  Labs Lab 11/11/15 0301  NA 139  K 4.4  CL 101  CO2 29  GLUCOSE 112*  BUN 10  CREATININE 1.12*  CALCIUM 9.2   ------------------------------------------------------------------------------------------------------------------  Cardiac Enzymes No results for input(s): TROPONINI in the last 168 hours. ------------------------------------------------------------------------------------------------------------------  RADIOLOGY:  Ct Soft Tissue Neck W Contrast  Result Date: 11/11/2015 CLINICAL DATA:  45 y/o F; pharyngitis with draining tonsils and concern for abscess. EXAM: CT NECK WITH CONTRAST TECHNIQUE: Multidetector CT imaging of the neck was performed using the standard protocol following the bolus administration of intravenous contrast. CONTRAST:  63mL ISOVUE-300 IOPAMIDOL (ISOVUE-300) INJECTION 61% COMPARISON:  None. FINDINGS: Pharynx and larynx: Massive enlargement of palatini tonsils contacting in the midline with multiple areas of hypoattenuation compatible with phlegmonous changes. There is a more discrete fluid collection in the left superior palatini tonsil measuring 11 x 11 x 7 mm (AP x ML x CC) (series 7, image 56 and series 6, image 56) which may represent a developing abscess. Inflammatory changes of the adjacent parapharyngeal fat is likely reactive. No prevertebral/retropharyngeal fluid collection. Salivary glands: No inflammation, mass, or stone. Thyroid: Normal. Lymph nodes: Bilateral upper cervical lymphadenopathy is likely reactive. The largest lymph node is in the left upper cervical chain and measures 27 x 25 mm axially. No lymph node necrosis. Vascular: Negative. Limited intracranial: Negative. Visualized orbits: Negative. Mastoids and visualized paranasal sinuses: Clear. Skeleton: Degenerative changes of the cervical spine with a prominent disc osteophyte complex at the C5-6 level with at least moderate canal stenosis. Upper chest: Negative. Other: None. IMPRESSION: 1.  Massive palatini tonsil  enlargement with multiple ill-defined areas of hypoattenuation compatible with phlegmonous changes. 2. More discrete left upper palatini tonsil fluid collection measuring up to 11 mm may represent an early abscess. 3. Mild reactive inflammatory changes of parapharyngeal fat and left-greater-than-right upper cervical lymphadenopathy. 4. Degenerative changes of cervical spine with prominent disc osteophyte complex at C5-6 level with at least moderate canal stenosis. Electronically Signed   By: Kristine Garbe M.D.   On: 11/11/2015 04:14     ASSESSMENT AND PLAN:   45 year old female with essential hypertension and morbid obesity with chronic kidney disease stage III who presents with sore throat and found to have tonsillitis with possible tonsillar abscess.  1. Tonsillitis with possible tonsillar abscess: Continue Unasyn and IV Decadron. ENT consult for a.m.  2. Hypothyroid: Continue Synthroid  3. Essential hypertension: Continue losartan  4. Depression: Continue Wellbutrin   Management plans discussed with the patient and she is in agreement.  CODE STATUS: full  TOTAL TIME TAKING CARE OF THIS PATIENT: 30 minutes.     POSSIBLE D/C 1-2 days, DEPENDING ON CLINICAL CONDITION.   Lucyle Alumbaugh M.D on 11/11/2015 at 10:07 AM  Between 7am to 6pm - Pager - [phone] After 6pm go to [email or web address] - password [email or web address] [facility]  Office  [phone]  CC: Primary care physician; No primary care provider on file.  Note: This dictation was prepared with Dragon dictation along with smaller phrase technology. Any transcriptional errors that result from this process are unintentional.

## 2015-11-11 NOTE — Progress Notes (Signed)
Initial Nutrition Assessment  DOCUMENTATION CODES:   Morbid obesity  INTERVENTION:  Diet advancement per MD Continue to monitor PO intake  NUTRITION DIAGNOSIS:   Inadequate oral intake related to poor appetite, acute illness as evidenced by per patient/family report.  GOAL:   Patient will meet greater than or equal to 90% of their needs  MONITOR:   PO intake, I & O's, Labs, Weight trends  REASON FOR ASSESSMENT:   Malnutrition Screening Tool    ASSESSMENT:   Laura Nolan  is a 45 y.o. female with a known history of GERD, hypertension, thyroid disease, vitamin B-12 deficiency, vitamin D deficiency presented to the emergency room with sore throat since one week.  [name] endorses no PO intake since this past Tuesday with 30# wt loss during that time span. She is tolerating the full liquid diet at this time, cold foods do best. States she cannot consume orange juice or anything sour or acidic, but otherwise she is doing well with liquids. Cannot consume solids at this time. Nutrition-Focused physical exam completed. Findings are no fat depletion, no muscle depletion, and no edema.  Labs and medications reviewed.  Diet Order:  Diet full liquid Room service appropriate? Yes; Fluid consistency: Thin  Skin:  Reviewed, no issues  Last BM:  PTA  Height:   Ht Readings from Last 1 Encounters:  11/11/15 5\' 6"  (1.676 m)    Weight:   Wt Readings from Last 1 Encounters:  11/11/15 (!) 470 lb (213.2 kg)    Ideal Body Weight:  59.09 kg  BMI:  Body mass index is 75.86 kg/m.  Estimated Nutritional Needs:   Kcal:  1500-1800 calories  Protein:  70-90 gm  Fluid:  >/= 1.5L  EDUCATION NEEDS:   No education needs identified at this time  Laura Nolan. Laura Pippins, MS, RD LDN Inpatient Clinical Dietitian Pager [phone]

## 2015-11-12 LAB — CBC
HEMATOCRIT: 39.2 % (ref 35.0–47.0)
HEMOGLOBIN: 13 g/dL (ref 12.0–16.0)
MCH: 29.5 pg (ref 26.0–34.0)
MCHC: 33.1 g/dL (ref 32.0–36.0)
MCV: 89 fL (ref 80.0–100.0)
PLATELETS: 254 10*3/uL (ref 150–440)
RBC: 4.41 MIL/uL (ref 3.80–5.20)
RDW: 14.2 % (ref 11.5–14.5)
WBC: 8.5 10*3/uL (ref 3.6–11.0)

## 2015-11-12 LAB — BASIC METABOLIC PANEL
ANION GAP: 7 (ref 5–15)
BUN: 11 mg/dL (ref 6–20)
CHLORIDE: 106 mmol/L (ref 101–111)
CO2: 25 mmol/L (ref 22–32)
Calcium: 8.8 mg/dL — ABNORMAL LOW (ref 8.9–10.3)
Creatinine, Ser: 0.95 mg/dL (ref 0.44–1.00)
GFR calc non Af Amer: >60 mL/min (ref >60)
Glucose, Bld: 170 mg/dL — ABNORMAL HIGH (ref 65–99)
POTASSIUM: 3.7 mmol/L (ref 3.5–5.1)
SODIUM: 138 mmol/L (ref 135–145)

## 2015-11-12 MED ORDER — PREDNISONE 10 MG PO TABS: 10.0000 mg | ORAL_TABLET | Freq: Every day | ORAL | 0 refills | Status: DC

## 2015-11-12 NOTE — Progress Notes (Signed)
Alert and oriented. Vital signs stable . No signs of acute distress. Discharge instructions given. Patient verbalized understanding. No other issues noted at this time.    

## 2015-11-12 NOTE — Discharge Summary (Signed)
[facility] [place] NAME: Laura Nolan    MR#:  [id number]  DATE OF BIRTH:  August 07, 1970  DATE OF ADMISSION:  11/11/2015 ADMITTING PHYSICIAN: Saundra Shelling, MD  DATE OF DISCHARGE: 11/12/2015 10:43 AM  PRIMARY CARE PHYSICIAN: No primary care provider on file.    ADMISSION DIAGNOSIS:  Peritonsillar abscess [J36] Sore throat [J02.9] Tonsillitis with exudate [J03.90]  DISCHARGE DIAGNOSIS:  Active Problems:   Tonsillar abscess   SECONDARY DIAGNOSIS:   Past Medical History:  Diagnosis Date  . Arthralgia   . Chronic renal disease, stage 3, moderately decreased glomerular filtration rate (GFR) between 30-59 mL/min/1.73 square meter   . Depression   . Elevated LFTs   . Endometrial cancer ([place])   . Endometrial cancer ([place])   . GERD (gastroesophageal reflux disease)   . Hypertension   . Morbid obesity ([place])   . Neuropathy due to chemotherapeutic drug ([place])   . Thyroid disease   . TMJ (dislocation of temporomandibular joint)   . Vitamin B12 deficiency   . Vitamin D deficiency     HOSPITAL COURSE:   45 year old female with essential hypertension and morbid obesity with chronic kidney disease stage III who presents with sore throat and found to have tonsillitis with possible tonsillar abscess.  1. Acute Tonsillitis with peritonsillar cellulitis and possible tonsillar abscess:  She was initiated on IV Unasyn and Decadron.  Her symptoms have dramatically improved. She was evaluated by ENT. She will continue on clindamycin and prednisone taper. She will follow up with ENT in 2 weeks.   2. Hypothyroid: Continue Synthroid  3. Essential hypertension: Continue losartan  4. Depression: Continue Wellbutrin   DISCHARGE CONDITIONS AND DIET:   Stable for discharge on heart healthy diet   CONSULTS OBTAINED:    DRUG ALLERGIES:   Allergies  Allergen Reactions  . Lisinopril Cough  . Macrodantin [Nitrofurantoin Macrocrystal] Itching   . Sulfa Antibiotics Itching and Swelling    DISCHARGE MEDICATIONS:   Discharge Medication List as of 11/12/2015  9:54 AM    START taking these medications   Details  predniSONE (DELTASONE) 10 MG tablet Take 1 tablet (10 mg total) by mouth daily with breakfast. 50 mg PO (ORAL)  x 2 days 40 mg PO (ORAL)  x 2 days 30 mg PO  (ORAL)  x 2 days 20 mg PO  (ORAL) x 2 days 10 mg PO  (ORAL) x 2 days then stop, Starting Mon 11/12/2015, Print      CONTINUE these medications which have NOT CHANGED   Details  acetaminophen (TYLENOL) 500 MG tablet Take 1,000 mg by mouth every 6 (six) hours as needed for mild pain., Historical Med    albuterol (PROVENTIL HFA;VENTOLIN HFA) 108 (90 Base) MCG/ACT inhaler Inhale 2 puffs into the lungs every 6 (six) hours as needed for wheezing or shortness of breath., Historical Med    buPROPion (WELLBUTRIN XL) 150 MG 24 hr tablet Take 150 mg by mouth 2 (two) times daily., Historical Med    clindamycin (CLEOCIN) 300 MG capsule Take 300 mg by mouth 4 (four) times daily., Historical Med    diphenhydrAMINE (BENADRYL) 25 MG tablet Take 25 mg by mouth every 6 (six) hours as needed for allergies., Historical Med    ergocalciferol (VITAMIN D2) 50000 units capsule Take 50,000 Units by mouth once a week. Patient takes on Wednesday., Historical Med    levothyroxine (SYNTHROID, LEVOTHROID) 75 MCG tablet Take 75 mcg by mouth daily before breakfast., Historical Med  loratadine (CLARITIN) 10 MG tablet Take 10 mg by mouth daily., Historical Med    LORazepam (ATIVAN) 1 MG tablet Take 1 mg by mouth 2 (two) times daily as needed for anxiety (nausea)., Historical Med    losartan (COZAAR) 50 MG tablet Take 50 mg by mouth daily., Historical Med    montelukast (SINGULAIR) 10 MG tablet Take 10 mg by mouth at bedtime., Historical Med    pantoprazole (PROTONIX) 40 MG tablet Take 40 mg by mouth 2 (two) times daily., Historical Med              Today   CHIEF COMPLAINT:   Patient is doing much better this morning. Swelling of the neck has decreased. She is able to tolerate her diet.   VITAL SIGNS:  Blood pressure 128/70, pulse 70, temperature 97.7 F (36.5 C), temperature source Oral, resp. rate 18, height 5\' 6"  (1.676 m), weight (!) 213.2 kg (470 lb), SpO2 99 %.   REVIEW OF SYSTEMS:  Review of Systems  Constitutional: Negative.  Negative for chills, fever and malaise/fatigue.  HENT: Negative.  Negative for ear discharge, ear pain, hearing loss, nosebleeds and sore throat.   Eyes: Negative.  Negative for blurred vision and pain.  Respiratory: Negative.  Negative for cough, hemoptysis, shortness of breath and wheezing.   Cardiovascular: Negative.  Negative for chest pain, palpitations and leg swelling.  Gastrointestinal: Negative.  Negative for abdominal pain, blood in stool, diarrhea, nausea and vomiting.  Genitourinary: Negative.  Negative for dysuria.  Musculoskeletal: Negative.  Negative for back pain.  Skin: Negative.   Neurological: Negative for dizziness, tremors, speech change, focal weakness, seizures and headaches.  Endo/Heme/Allergies: Negative.  Does not bruise/bleed easily.  Psychiatric/Behavioral: Negative.  Negative for depression, hallucinations and suicidal ideas.     PHYSICAL EXAMINATION:  GENERAL:  45 y.o.-year-old patient lying in the bed with no acute distress.  NECK:  Supple, no jugular venous distention. No thyroid enlargement,  Has much decreased since yesterday there is no tenderness LUNGS: Normal breath sounds bilaterally, no wheezing, rales,rhonchi  No use of accessory muscles of respiration.  CARDIOVASCULAR: S1, S2 normal. No murmurs, rubs, or gallops.  ABDOMEN: Soft, non-tender, non-distended. Bowel sounds present. No organomegaly or mass.  EXTREMITIES: No pedal edema, cyanosis, or clubbing.  PSYCHIATRIC: The patient is alert and oriented x 3.  SKIN: No obvious rash, lesion, or ulcer.   DATA REVIEW:   CBC  Recent  Labs Lab 11/12/15 0431  WBC 8.5  HGB 13.0  HCT 39.2  PLT 254    Chemistries   Recent Labs Lab 11/12/15 0431  NA 138  K 3.7  CL 106  CO2 25  GLUCOSE 170*  BUN 11  CREATININE 0.95  CALCIUM 8.8*    Cardiac Enzymes No results for input(s): TROPONINI in the last 168 hours.  Microbiology Results  @[email or web address] @  RADIOLOGY:  Ct Soft Tissue Neck W Contrast  Result Date: 11/11/2015 CLINICAL DATA:  45 y/o F; pharyngitis with draining tonsils and concern for abscess. EXAM: CT NECK WITH CONTRAST TECHNIQUE: Multidetector CT imaging of the neck was performed using the standard protocol following the bolus administration of intravenous contrast. CONTRAST:  49mL ISOVUE-300 IOPAMIDOL (ISOVUE-300) INJECTION 61% COMPARISON:  None. FINDINGS: Pharynx and larynx: Massive enlargement of palatini tonsils contacting in the midline with multiple areas of hypoattenuation compatible with phlegmonous changes. There is a more discrete fluid collection in the left superior palatini tonsil measuring 11 x 11 x 7 mm (AP x ML x CC) (series 7,  image 56 and series 6, image 56) which may represent a developing abscess. Inflammatory changes of the adjacent parapharyngeal fat is likely reactive. No prevertebral/retropharyngeal fluid collection. Salivary glands: No inflammation, mass, or stone. Thyroid: Normal. Lymph nodes: Bilateral upper cervical lymphadenopathy is likely reactive. The largest lymph node is in the left upper cervical chain and measures 27 x 25 mm axially. No lymph node necrosis. Vascular: Negative. Limited intracranial: Negative. Visualized orbits: Negative. Mastoids and visualized paranasal sinuses: Clear. Skeleton: Degenerative changes of the cervical spine with a prominent disc osteophyte complex at the C5-6 level with at least moderate canal stenosis. Upper chest: Negative. Other: None. IMPRESSION: 1. Massive palatini tonsil enlargement with multiple ill-defined areas of hypoattenuation compatible  with phlegmonous changes. 2. More discrete left upper palatini tonsil fluid collection measuring up to 11 mm may represent an early abscess. 3. Mild reactive inflammatory changes of parapharyngeal fat and left-greater-than-right upper cervical lymphadenopathy. 4. Degenerative changes of cervical spine with prominent disc osteophyte complex at C5-6 level with at least moderate canal stenosis. Electronically Signed   By: Kristine Garbe M.D.   On: 11/11/2015 04:14      Management plans discussed with the patient and she is in agreement. Stable for discharge home [place] staff Patient should follow up with ENT  CODE STATUS:     Code Status Orders        Start     Ordered   11/11/15 0630  Full code  Continuous     11/11/15 0629    Code Status History    Date Active Date Inactive Code Status Order ID Comments User Context   This patient has a current code status but no historical code status.      TOTAL TIME TAKING CARE OF THIS PATIENT: 36 minutes.    Note: This dictation was prepared with Dragon dictation along with smaller phrase technology. Any transcriptional errors that result from this process are unintentional.  Dmitriy Gair M.D on 11/12/2015 at 12:34 PM  Between 7am to 6pm - Pager - [phone] After 6pm go to [email or web address] - password [email or web address]  Office  [phone]  CC: Primary care physician; No primary care provider on file.

## 2015-11-12 NOTE — Discharge Instructions (Signed)
Peritonsillar Abscess  A peritonsillar abscess is a collection of yellowish-white fluid (pus) in the back of the throat. It forms behind the tonsils. Treatment usually involves draining the fluid. This may be done by:  Putting a needle into the abscess.  Cutting and draining the abscess.  HOME CARE  Rest as much as you can.  Take medicines only as told by your doctor.  If you were given an antibiotic medicine, finish it all even if you start to feel better.  If your abscess was drained by your doctor:  Mix 1 teaspoon of salt in 8 ounces of warm water for gargling.  Gargle 4 times per day or as needed for comfort.  Do not swallow this mixture.  Drink a lot of fluids.  Eat soft or liquid foods while your throat is sore. Frozen ice pops and ice cream are good choices.  Keep all follow-up visits as told by your doctor. This is important.  GET HELP IF:  You have more pain, swelling, redness, or drainage in your throat.  You have a headache, have low energy, or feel sick.  You have a fever.  You feel dizzy.  You have trouble swallowing or eating.  You have signs of body fluid loss (dehydration):  Light-headedness when you are standing.  Peeing (urinating) less.  A fast heart rate.  Dry mouth.  GET HELP RIGHT AWAY IF:  You have trouble talking or breathing.  You find it easier to breathe when you lean forward.  You are coughing up blood or throwing up (vomiting) blood.  You have severe throat pain that is not helped by medicines.  You start to drool.     This information is not intended to replace advice given to you by your health care provider. Make sure you discuss any questions you have with your health care provider.     Document Released: 01/01/2009 Document Revised: 02/03/2014 Document Reviewed: 08/29/2013  Elsevier Interactive Patient Education ©2016 [employer]

## 2015-11-13 LAB — CULTURE, GROUP A STREP (THRC): Special Requests: NORMAL

## 2015-11-16 LAB — CULTURE, BLOOD (ROUTINE X 2)
CULTURE: NO GROWTH
Culture: NO GROWTH

## 2016-10-05 ENCOUNTER — Emergency Department
Admission: EM | Admit: 2016-10-05 | Discharge: 2016-10-05 | Disposition: A | Discharge status: Home / Self Care | Payer: 59 | Attending: Emergency Medicine | Admitting: Emergency Medicine

## 2016-10-05 ENCOUNTER — Encounter: Payer: Self-pay | Admitting: Emergency Medicine

## 2016-10-05 DIAGNOSIS — M542 Cervicalgia: Secondary | ICD-10-CM | POA: Insufficient documentation

## 2016-10-05 DIAGNOSIS — Z8542 Personal history of malignant neoplasm of other parts of uterus: Secondary | ICD-10-CM | POA: Insufficient documentation

## 2016-10-05 DIAGNOSIS — G510 Bell's palsy: Secondary | ICD-10-CM | POA: Diagnosis not present

## 2016-10-05 DIAGNOSIS — R29810 Facial weakness: Secondary | ICD-10-CM | POA: Diagnosis present

## 2016-10-05 DIAGNOSIS — Z79899 Other long term (current) drug therapy: Secondary | ICD-10-CM | POA: Diagnosis not present

## 2016-10-05 DIAGNOSIS — E079 Disorder of thyroid, unspecified: Secondary | ICD-10-CM | POA: Insufficient documentation

## 2016-10-05 DIAGNOSIS — I10 Essential (primary) hypertension: Secondary | ICD-10-CM | POA: Insufficient documentation

## 2016-10-05 MED ORDER — AMOXICILLIN-POT CLAVULANATE 875-125 MG PO TABS: 1.0000 | ORAL_TABLET | Freq: Two times a day (BID) | ORAL | 0 refills | Status: AC

## 2016-10-05 MED ORDER — VALACYCLOVIR HCL 1 G PO TABS: 1000.0000 mg | ORAL_TABLET | Freq: Three times a day (TID) | ORAL | 0 refills | Status: AC

## 2016-10-05 MED ORDER — PREDNISONE 20 MG PO TABS: 60.0000 mg | ORAL_TABLET | Freq: Every day | ORAL | 0 refills | Status: AC

## 2016-10-05 NOTE — ED Provider Notes (Signed)
[facility] Emergency Department Provider Note   ____________________________________________   I have reviewed the triage vital signs and the nursing notes.   HISTORY  Chief Complaint Facial Droop and Neck Pain   History limited by: Not Limited   HPI Laura Nolan is a 46 y.o. female who presents to the emergency department today Because of concerns for right sided facial droop. Patient states she first noticed it this morning. It then progressed throughout the day. She feels it just the right side of her face. She has odd sensation to her. She is also noticed a change in her taste. Patient   has a sensation of muffled hearing on that side. Denies any history of Bell's palsy.   Past Medical History:  Diagnosis Date  . Arthralgia   . Chronic renal disease, stage 3, moderately decreased glomerular filtration rate (GFR) between 30-59 mL/min/1.73 square meter   . Depression   . Elevated LFTs   . Endometrial cancer ([place])   . Endometrial cancer ([place])   . GERD (gastroesophageal reflux disease)   . Hypertension   . Morbid obesity ([place])   . Neuropathy due to chemotherapeutic drug ([place])   . Thyroid disease   . TMJ (dislocation of temporomandibular joint)   . Vitamin B12 deficiency   . Vitamin D deficiency     Patient Active Problem List   Diagnosis Date Noted  . Tonsillar abscess 11/11/2015    Past Surgical History:  Procedure Laterality Date  . CHOLECYSTECTOMY    . FRACTURE SURGERY    . NEPHRECTOMY Right     Prior to Admission medications   Medication Sig Start Date End Date Taking? Authorizing Provider  acetaminophen (TYLENOL) 500 MG tablet Take 1,000 mg by mouth every 6 (six) hours as needed for mild pain.    [provider]  albuterol (PROVENTIL HFA;VENTOLIN HFA) 108 (90 Base) MCG/ACT inhaler Inhale 2 puffs into the lungs every 6 (six) hours as needed for wheezing or shortness of breath.    [provider]  buPROPion (WELLBUTRIN  XL) 150 MG 24 hr tablet Take 150 mg by mouth 2 (two) times daily.    [provider]  clindamycin (CLEOCIN) 300 MG capsule Take 300 mg by mouth 4 (four) times daily.    [provider]  diphenhydrAMINE (BENADRYL) 25 MG tablet Take 25 mg by mouth every 6 (six) hours as needed for allergies.    [provider]  ergocalciferol (VITAMIN D2) 50000 units capsule Take 50,000 Units by mouth once a week. Patient takes on Wednesday.    [provider]  levothyroxine (SYNTHROID, LEVOTHROID) 75 MCG tablet Take 75 mcg by mouth daily before breakfast.    [provider]  loratadine (CLARITIN) 10 MG tablet Take 10 mg by mouth daily.    [provider]  LORazepam (ATIVAN) 1 MG tablet Take 1 mg by mouth 2 (two) times daily as needed for anxiety (nausea).    [provider]  losartan (COZAAR) 50 MG tablet Take 50 mg by mouth daily.    [provider]  montelukast (SINGULAIR) 10 MG tablet Take 10 mg by mouth at bedtime.    [provider]  pantoprazole (PROTONIX) 40 MG tablet Take 40 mg by mouth 2 (two) times daily.    [provider]  predniSONE (DELTASONE) 10 MG tablet Take 1 tablet (10 mg total) by mouth daily with breakfast. 50 mg PO (ORAL)  x 2 days 40 mg PO (ORAL)  x 2 days  30 mg PO  (ORAL)  x 2 days 20 mg PO  (ORAL) x 2 days 10 mg PO  (ORAL) x 2 days then stop 11/12/15   Bettey Costa, MD    Allergies Lisinopril; Macrodantin [nitrofurantoin macrocrystal]; and Sulfa antibiotics  Family History  Problem Relation Age of Onset  . Hypertension Mother   . Diabetes Father   . Hypertension Father     Social History Social History  Substance Use Topics  . Smoking status: Never Smoker  . Smokeless tobacco: Never Used  . Alcohol use Yes    Review of Systems Constitutional: No fever/chills Eyes: No visual changes. ENT: Positive for right ear pain.  Cardiovascular: Denies chest pain. Respiratory: Denies shortness  of breath. Gastrointestinal: No abdominal pain.  No nausea, no vomiting.  No diarrhea.   Genitourinary: Negative for dysuria. Musculoskeletal: Negative for back pain. Skin: Negative for rash. Neurological: Positive for right facial numbness.   ____________________________________________   PHYSICAL EXAM:  VITAL SIGNS: ED Triage Vitals  Enc Vitals Group     BP --      Pulse --      Resp --      Temp --      Temp src --      SpO2 --      Weight 10/05/16 1953 (!) 510 lb (231.3 kg)     Height 10/05/16 1953 5\' 6"  (1.676 m)     Head Circumference --      Peak Flow --      Pain Score 10/05/16 1952 2     Pain Loc --      Pain Edu? --      Excl. in [place]? --      Constitutional: Alert and oriented. Well appearing and in no distress. Eyes: Conjunctivae are normal.  ENT   Head: Normocephalic and atraumatic. TMs wnl bilaterally   Nose: No congestion/rhinnorhea.   Mouth/Throat: Mucous membranes are moist.   Neck: No stridor. Hematological/Lymphatic/Immunilogical: No cervical lymphadenopathy. Cardiovascular: Normal rate, regular rhythm.  No murmurs, rubs, or gallops.  Respiratory: Normal respiratory effort without tachypnea nor retractions. Breath sounds are clear and equal bilaterally. No wheezes/rales/rhonchi. Gastrointestinal: Soft and non tender. No rebound. No guarding.  Genitourinary: Deferred Musculoskeletal: Normal range of motion in all extremities. Neurologic:  Right sided facial droop. Forehead is involved.  Skin:  Skin is warm, dry and intact. No rash noted. Psychiatric: Mood and affect are normal. Speech and behavior are normal. Patient exhibits appropriate insight and judgment.  ____________________________________________    LABS (pertinent positives/negatives)  None  ____________________________________________   EKG  I, Nance Pear, attending physician, personally viewed and interpreted this EKG  EKG Time: 1955 Rate: 92 Rhythm: normal  sinus rhythm Axis: normal Intervals: qtc 462 QRS: narrow ST changes: no st elevation Impression: low voltage otherwise normal ekg   ____________________________________________    RADIOLOGY  None  ____________________________________________   PROCEDURES  Procedures  ____________________________________________   INITIAL IMPRESSION / ASSESSMENT AND PLAN / ED COURSE  Pertinent labs & imaging results that were available during my care of the patient were reviewed by me and considered in my medical decision making (see chart for details).  Patient presented to the emergency department today because of concerns for right-sided facial droop. Patient's exam is consistent with Bell's palsy. Will give medication. ___________________________   FINAL CLINICAL IMPRESSION(S) / ED DIAGNOSES  Final diagnoses:  Bell's palsy     Note: This dictation was prepared with Dragon dictation. Any transcriptional errors that  result from this process are unintentional     Nance Pear, MD 10/05/16 2052

## 2016-10-05 NOTE — Discharge Instructions (Signed)
Please seek medical attention for any high fevers, chest pain, shortness of breath, change in behavior, persistent vomiting, bloody stool or any other new or concerning symptoms.  

## 2016-10-05 NOTE — ED Triage Notes (Signed)
Pt c/o right sided facial drooping since about 10am today; says she was brushing her teeth when she noticed she was drooping; flat folds to right side of mouth and right eye is drooping; difficult to close completely; vision blurred; pt also having pain to the right side of her neck; mild headache the moves up from the right side of her neck; pt talking in complete coherent sentences; pt says a friend told her earlier that her speech sounded slurred; currently clear and concise; denies difficulty walking or grasping;

## 2016-12-15 ENCOUNTER — Emergency Department
Admission: EM | Admit: 2016-12-15 | Discharge: 2016-12-15 | Disposition: A | Discharge status: Home / Self Care | Payer: Commercial Managed Care - HMO | Source: Home / Self Care | Attending: Emergency Medicine | Admitting: Emergency Medicine

## 2016-12-15 ENCOUNTER — Other Ambulatory Visit: Payer: Self-pay

## 2016-12-15 ENCOUNTER — Emergency Department: Payer: Commercial Managed Care - HMO

## 2016-12-15 DIAGNOSIS — L0390 Cellulitis, unspecified: Secondary | ICD-10-CM

## 2016-12-15 DIAGNOSIS — L03311 Cellulitis of abdominal wall: Secondary | ICD-10-CM | POA: Insufficient documentation

## 2016-12-15 DIAGNOSIS — Z79899 Other long term (current) drug therapy: Secondary | ICD-10-CM

## 2016-12-15 DIAGNOSIS — R1110 Vomiting, unspecified: Secondary | ICD-10-CM | POA: Insufficient documentation

## 2016-12-15 DIAGNOSIS — N183 Chronic kidney disease, stage 3 (moderate): Secondary | ICD-10-CM

## 2016-12-15 DIAGNOSIS — L03317 Cellulitis of buttock: Secondary | ICD-10-CM | POA: Diagnosis not present

## 2016-12-15 DIAGNOSIS — I129 Hypertensive chronic kidney disease with stage 1 through stage 4 chronic kidney disease, or unspecified chronic kidney disease: Secondary | ICD-10-CM | POA: Insufficient documentation

## 2016-12-15 LAB — COMPREHENSIVE METABOLIC PANEL
ALBUMIN: 2.8 g/dL — AB (ref 3.5–5.0)
ALK PHOS: 85 U/L (ref 38–126)
ALT: 31 U/L (ref 14–54)
AST: 30 U/L (ref 15–41)
Anion gap: 13 (ref 5–15)
BILIRUBIN TOTAL: 1.6 mg/dL — AB (ref 0.3–1.2)
BUN: 12 mg/dL (ref 6–20)
CALCIUM: 8.4 mg/dL — AB (ref 8.9–10.3)
CO2: 21 mmol/L — ABNORMAL LOW (ref 22–32)
CREATININE: 0.97 mg/dL (ref 0.44–1.00)
Chloride: 101 mmol/L (ref 101–111)
GFR calc Af Amer: >60 mL/min (ref >60)
GLUCOSE: 124 mg/dL — AB (ref 65–99)
Potassium: 3.6 mmol/L (ref 3.5–5.1)
Sodium: 135 mmol/L (ref 135–145)
TOTAL PROTEIN: 7.6 g/dL (ref 6.5–8.1)

## 2016-12-15 LAB — LACTIC ACID, PLASMA: Lactic Acid, Venous: 1.4 mmol/L (ref 0.5–1.9)

## 2016-12-15 LAB — CBC WITH DIFFERENTIAL/PLATELET
BASOS ABS: 0.1 10*3/uL (ref 0–0.1)
BASOS PCT: 1 %
EOS ABS: 0.1 10*3/uL (ref 0–0.7)
EOS PCT: 1 %
HCT: 37.3 % (ref 35.0–47.0)
Hemoglobin: 12.3 g/dL (ref 12.0–16.0)
Lymphocytes Relative: 10 %
Lymphs Abs: 1 10*3/uL (ref 1.0–3.6)
MCH: 29.4 pg (ref 26.0–34.0)
MCHC: 33.1 g/dL (ref 32.0–36.0)
MCV: 89 fL (ref 80.0–100.0)
MONO ABS: 1.1 10*3/uL — AB (ref 0.2–0.9)
Monocytes Relative: 11 %
Neutro Abs: 7.3 10*3/uL — ABNORMAL HIGH (ref 1.4–6.5)
Neutrophils Relative %: 77 %
PLATELETS: 370 10*3/uL (ref 150–440)
RBC: 4.19 MIL/uL (ref 3.80–5.20)
RDW: 14.9 % — AB (ref 11.5–14.5)
WBC: 9.5 10*3/uL (ref 3.6–11.0)

## 2016-12-15 MED ORDER — CLINDAMYCIN HCL 300 MG PO CAPS: 300.0000 mg | ORAL_CAPSULE | Freq: Three times a day (TID) | ORAL | 0 refills | Status: DC

## 2016-12-15 MED ORDER — ONDANSETRON 4 MG PO TBDP: 4.0000 mg | ORAL_TABLET | Freq: Three times a day (TID) | ORAL | 0 refills | Status: DC | PRN

## 2016-12-15 MED ORDER — SODIUM CHLORIDE 0.9 % IV BOLUS (SEPSIS)
1000.0000 mL | Freq: Once | INTRAVENOUS | Status: AC
  Administered 2016-12-15: 1000 mL via INTRAVENOUS

## 2016-12-15 MED ORDER — CLINDAMYCIN PHOSPHATE 600 MG/50ML IV SOLN
600.0000 mg | Freq: Once | INTRAVENOUS | Status: AC
  Administered 2016-12-15: 600 mg via INTRAVENOUS
  Filled 2016-12-15: qty 50

## 2016-12-15 NOTE — ED Provider Notes (Signed)
[facility] Emergency Department Provider Note   ____________________________________________   First MD Initiated Contact with Patient 12/15/16 [phone]     (approximate)  I have reviewed the triage vital signs and the nursing notes.   HISTORY  Chief Complaint Emesis; Fever; and Wound Infection    HPI Laura Nolan is a 46 y.o. female who comes into the hospital today with fever and vomiting since Tuesday.  The patient reports that she also has an infection on her left hip.  She reports that she has these issues is every once in a while.  She reports that she noticed the redness to her hip on Thursday.  She states that is been getting worse.  She has been trying to keep it clean but reports that it is just been getting redder and redder.  She was going to go to the doctor today but then vomited again.  She reports that she contacted the nurse at her doctor's office and was told to come in and get checked out.  The patient states that her pain is an 8 out of 10 in intensity with touch.  Her temperatures have been 100.6 201.6 at home.  The patient states that she feels dehydrated.  Her tongue is also coated white and everything tastes strange.  She reports that she has not been able to eat a lot but she has been able to drink some Gatorade today.  She reports that the vomiting is not that severe.  The patient is here for evaluation.   Past Medical History:  Diagnosis Date  . Arthralgia   . Chronic renal disease, stage 3, moderately decreased glomerular filtration rate (GFR) between 30-59 mL/min/1.73 square meter   . Depression   . Elevated LFTs   . Endometrial cancer ([place])   . Endometrial cancer ([place])   . GERD (gastroesophageal reflux disease)   . Hypertension   . Morbid obesity ([place])   . Neuropathy due to chemotherapeutic drug ([place])   . Thyroid disease   . TMJ (dislocation of temporomandibular joint)   . Vitamin B12 deficiency   . Vitamin D deficiency      Patient Active Problem List   Diagnosis Date Noted  . Tonsillar abscess 11/11/2015    Past Surgical History:  Procedure Laterality Date  . CHOLECYSTECTOMY    . FRACTURE SURGERY    . NEPHRECTOMY Right     Prior to Admission medications   Medication Sig Start Date End Date Taking? Authorizing Provider  acetaminophen (TYLENOL) 500 MG tablet Take 1,000 mg by mouth every 6 (six) hours as needed for mild pain.    [provider]  albuterol (PROVENTIL HFA;VENTOLIN HFA) 108 (90 Base) MCG/ACT inhaler Inhale 2 puffs into the lungs every 6 (six) hours as needed for wheezing or shortness of breath.    [provider]  buPROPion (WELLBUTRIN XL) 150 MG 24 hr tablet Take 150 mg by mouth 2 (two) times daily.    [provider]  clindamycin (CLEOCIN) 300 MG capsule Take 300 mg by mouth 4 (four) times daily.    [provider]  diphenhydrAMINE (BENADRYL) 25 MG tablet Take 25 mg by mouth every 6 (six) hours as needed for allergies.    [provider]  ergocalciferol (VITAMIN D2) 50000 units capsule Take 50,000 Units by mouth once a week. Patient takes on Wednesday.    [provider]  levothyroxine (SYNTHROID, LEVOTHROID) 75 MCG tablet Take 75 mcg by mouth daily before breakfast.  [provider]  loratadine (CLARITIN) 10 MG tablet Take 10 mg by mouth daily.    [provider]  LORazepam (ATIVAN) 1 MG tablet Take 1 mg by mouth 2 (two) times daily as needed for anxiety (nausea).    [provider]  losartan (COZAAR) 50 MG tablet Take 50 mg by mouth daily.    [provider]  montelukast (SINGULAIR) 10 MG tablet Take 10 mg by mouth at bedtime.    [provider]  pantoprazole (PROTONIX) 40 MG tablet Take 40 mg by mouth 2 (two) times daily.    [provider]  predniSONE (DELTASONE) 10 MG tablet Take 1 tablet (10 mg total) by mouth daily with breakfast. 50 mg PO (ORAL)  x 2 days 40 mg PO (ORAL)  x  2 days 30 mg PO  (ORAL)  x 2 days 20 mg PO  (ORAL) x 2 days 10 mg PO  (ORAL) x 2 days then stop 11/12/15   Bettey Costa, MD    Allergies Lisinopril; Macrodantin [nitrofurantoin macrocrystal]; and Sulfa antibiotics  Family History  Problem Relation Age of Onset  . Hypertension Mother   . Diabetes Father   . Hypertension Father     Social History Social History   Tobacco Use  . Smoking status: Never Smoker  . Smokeless tobacco: Never Used  Substance Use Topics  . Alcohol use: Yes  . Drug use: Not on file    Review of Systems  Constitutional: No fever/chills Eyes: No visual changes. ENT: No sore throat. Cardiovascular: Denies chest pain. Respiratory: Denies shortness of breath. Gastrointestinal: No abdominal pain.  No nausea, no vomiting.  No diarrhea.  No constipation. Genitourinary: Negative for dysuria. Musculoskeletal: Negative for back pain. Skin: Redness to the patient's left hip/abdomen on her pannus Neurological: Negative for headaches, focal weakness or numbness.   ____________________________________________   PHYSICAL EXAM:  VITAL SIGNS: ED Triage Vitals  Enc Vitals Group     BP 12/15/16 0039 (!) 155/86     Pulse Rate 12/15/16 0039 91     Resp 12/15/16 0039 18     Temp 12/15/16 0039 100.2 F (37.9 C)     Temp Source 12/15/16 0039 Oral     SpO2 12/15/16 0039 97 %     Weight 12/15/16 0039 (!) 515 lb (233.6 kg)     Height 12/15/16 0039 5\' 6"  (1.676 m)     Head Circumference --      Peak Flow --      Pain Score 12/15/16 0038 8     Pain Loc --      Pain Edu? --      Excl. in [place]? --     Constitutional: Alert and oriented. Well appearing and in moderate distress. Eyes: Conjunctivae are normal. PERRL. EOMI. Head: Atraumatic. Nose: No congestion/rhinnorhea. Mouth/Throat: Mucous membranes are moist.  Oropharynx non-erythematous. Cardiovascular: Tachycardia regular rhythm. Grossly normal heart sounds.  Good peripheral circulation. Respiratory:  Normal respiratory effort.  No retractions. Lungs CTAB. Gastrointestinal: Soft and nontender. No distention.  Musculoskeletal: No lower extremity tenderness nor edema.  Neurologic:  Normal speech and language.  Skin:  Skin is warm, dry and intact.  Redness to the patient's left side along her pannus.  There is some firmness and induration but also an area of weeping with some more soft skin/fluctuance.  Skin is hot to touch and tender to palpation Psychiatric: Mood and affect are normal.   ____________________________________________   LABS (all labs ordered are listed, but  only abnormal results are displayed)  Labs Reviewed  COMPREHENSIVE METABOLIC PANEL - Abnormal; Notable for the following components:      Result Value   CO2 21 (*)    Glucose, Bld 124 (*)    Calcium 8.4 (*)    Albumin 2.8 (*)    Total Bilirubin 1.6 (*)    All other components within normal limits  CBC WITH DIFFERENTIAL/PLATELET - Abnormal; Notable for the following components:   RDW 14.9 (*)    Neutro Abs 7.3 (*)    Monocytes Absolute 1.1 (*)    All other components within normal limits  LACTIC ACID, PLASMA  LACTIC ACID, PLASMA   ____________________________________________  EKG  none ____________________________________________  RADIOLOGY  US Abdomen Limited  Result Date: 12/15/2016 CLINICAL DATA:  Redness and swelling of left lower abdominal/pelvic wall with drainage EXAM: ULTRASOUND ABDOMEN LIMITED COMPARISON:  None. FINDINGS: Longitudinal and transverse images were obtained along the lower left abdominal and pelvic wall in the area of redness and edema. Ultrasound of this area shows induration in the subcutaneous tissues consistent with cellulitis. There is no well-defined mass or fluid collection. No focal abscess is demonstrated. A with well-defined sinus tract is not evident on this study. IMPRESSION: Subcutaneous left lower abdominal/pelvic wall cellulitis, fairly diffuse. No well-defined sinus  tract or abscess. No well-defined fluid collection. No mass or adenopathy. Electronically Signed   By: Lowella Grip III M.D.   On: 12/15/2016 07:35    ____________________________________________   PROCEDURES  Procedure(s) performed: None  Procedures  Critical Care performed: No  ____________________________________________   INITIAL IMPRESSION / ASSESSMENT AND PLAN / ED COURSE  As part of my medical decision making, I reviewed the following data within the electronic MEDICAL RECORD NUMBER Notes from prior ED visits and [place] Controlled Substance Database   This is a 46 year old female who comes into the hospital today with some fever vomiting and redness to her pannus.    My differential diagnosis includes cellulitis versus abscess.  The patient did have some blood work drawn that was unremarkable.  The patient does not have an elevated white blood cell count nor does she have any abnormal electrolytes.  I did initially plan to send the patient for CT to look for an abscess but she was unable to fit into the CT scanner based on her weight.  I then ordered an ultrasound of the area of cellulitis.   The ultrasound showed some subcutaneous left lower abdominal/pelvic wall cellulitis that is fairly diffuse with no defined sinus tract or abscess.  No well-defined fluid collection mass or adenopathy.  The patient did receive a dose of clindamycin.  Since her vital signs are improved and her blood work is unremarkable I will discharge the patient with some oral antibiotics.  She should follow-up with her primary care physician or return with any worsening conditions or concerns.      ____________________________________________   FINAL CLINICAL IMPRESSION(S) / ED DIAGNOSES  Final diagnoses:  Cellulitis     ED Discharge Orders    None       Note:  This document was prepared using Dragon voice recognition software and may include unintentional dictation errors.    Loney Hering, MD 12/15/16 0800

## 2016-12-15 NOTE — ED Triage Notes (Signed)
Pt reports fever with vomiting since Tuesday, pt also states that she has a wound on her left hip, pt reports that sometimes she gets a yeast infection on her skin

## 2016-12-15 NOTE — ED Notes (Signed)
Pt discharged home after verbalizing understanding of discharge instructions; nad noted.  External catheter removed; output 500cc

## 2016-12-15 NOTE — ED Notes (Signed)
Pt waiting patiently in lobby for treatment room; pt says she took her own tylenol about 10 minutes ago because she felt like her fever was returning; temp 99.8 at present; pt says she generally just feels bad; pt with skin infection to left hip/buttock area that is very painful to touch;

## 2016-12-15 NOTE — Discharge Instructions (Signed)
Please follow up with your primary care physician for further evaluation of your cellulitis

## 2016-12-15 NOTE — ED Notes (Signed)
Resumed care from [place], [place]. Pt resting comfortably with nad noted. VS WNL. Will continue to monitor.

## 2016-12-17 ENCOUNTER — Encounter: Payer: Self-pay | Admitting: Emergency Medicine

## 2016-12-17 ENCOUNTER — Inpatient Hospital Stay
Admission: EM | Admit: 2016-12-17 | Discharge: 2016-12-22 | DRG: 603 | Disposition: A | Discharge status: Home / Self Care | Payer: Commercial Managed Care - HMO | Attending: Internal Medicine | Admitting: Internal Medicine

## 2016-12-17 ENCOUNTER — Other Ambulatory Visit: Payer: Self-pay

## 2016-12-17 DIAGNOSIS — Z9221 Personal history of antineoplastic chemotherapy: Secondary | ICD-10-CM | POA: Diagnosis not present

## 2016-12-17 DIAGNOSIS — Z888 Allergy status to other drugs, medicaments and biological substances status: Secondary | ICD-10-CM

## 2016-12-17 DIAGNOSIS — Z6841 Body Mass Index (BMI) 40.0 and over, adult: Secondary | ICD-10-CM | POA: Diagnosis not present

## 2016-12-17 DIAGNOSIS — L03317 Cellulitis of buttock: Secondary | ICD-10-CM | POA: Diagnosis present

## 2016-12-17 DIAGNOSIS — Z8542 Personal history of malignant neoplasm of other parts of uterus: Secondary | ICD-10-CM | POA: Diagnosis not present

## 2016-12-17 DIAGNOSIS — B370 Candidal stomatitis: Secondary | ICD-10-CM | POA: Diagnosis present

## 2016-12-17 DIAGNOSIS — Z881 Allergy status to other antibiotic agents status: Secondary | ICD-10-CM | POA: Diagnosis not present

## 2016-12-17 DIAGNOSIS — N179 Acute kidney failure, unspecified: Secondary | ICD-10-CM | POA: Diagnosis present

## 2016-12-17 DIAGNOSIS — B9620 Unspecified Escherichia coli [E. coli] as the cause of diseases classified elsewhere: Secondary | ICD-10-CM | POA: Diagnosis present

## 2016-12-17 DIAGNOSIS — K219 Gastro-esophageal reflux disease without esophagitis: Secondary | ICD-10-CM | POA: Diagnosis present

## 2016-12-17 DIAGNOSIS — Z882 Allergy status to sulfonamides status: Secondary | ICD-10-CM | POA: Diagnosis not present

## 2016-12-17 DIAGNOSIS — Z905 Acquired absence of kidney: Secondary | ICD-10-CM | POA: Diagnosis not present

## 2016-12-17 DIAGNOSIS — Z8249 Family history of ischemic heart disease and other diseases of the circulatory system: Secondary | ICD-10-CM

## 2016-12-17 DIAGNOSIS — E039 Hypothyroidism, unspecified: Secondary | ICD-10-CM | POA: Diagnosis present

## 2016-12-17 DIAGNOSIS — N390 Urinary tract infection, site not specified: Secondary | ICD-10-CM | POA: Diagnosis present

## 2016-12-17 DIAGNOSIS — L8990 Pressure ulcer of unspecified site, unspecified stage: Secondary | ICD-10-CM

## 2016-12-17 DIAGNOSIS — L0390 Cellulitis, unspecified: Secondary | ICD-10-CM

## 2016-12-17 DIAGNOSIS — I10 Essential (primary) hypertension: Secondary | ICD-10-CM | POA: Diagnosis present

## 2016-12-17 LAB — COMPREHENSIVE METABOLIC PANEL
ALT: 62 U/L — ABNORMAL HIGH (ref 14–54)
AST: 54 U/L — AB (ref 15–41)
Albumin: 2.6 g/dL — ABNORMAL LOW (ref 3.5–5.0)
Alkaline Phosphatase: 91 U/L (ref 38–126)
Anion gap: 12 (ref 5–15)
BILIRUBIN TOTAL: 2.1 mg/dL — AB (ref 0.3–1.2)
BUN: 11 mg/dL (ref 6–20)
CHLORIDE: 99 mmol/L — AB (ref 101–111)
CO2: 25 mmol/L (ref 22–32)
CREATININE: 1.14 mg/dL — AB (ref 0.44–1.00)
Calcium: 8.4 mg/dL — ABNORMAL LOW (ref 8.9–10.3)
GFR, EST NON AFRICAN AMERICAN: 57 mL/min — AB (ref >60)
Glucose, Bld: 107 mg/dL — ABNORMAL HIGH (ref 65–99)
POTASSIUM: 4.2 mmol/L (ref 3.5–5.1)
Sodium: 136 mmol/L (ref 135–145)
TOTAL PROTEIN: 7.7 g/dL (ref 6.5–8.1)

## 2016-12-17 LAB — URINALYSIS, COMPLETE (UACMP) WITH MICROSCOPIC
Bilirubin Urine: NEGATIVE
GLUCOSE, UA: NEGATIVE mg/dL
HGB URINE DIPSTICK: NEGATIVE
KETONES UR: 5 mg/dL — AB
NITRITE: NEGATIVE
PH: 6 (ref 5.0–8.0)
Protein, ur: 30 mg/dL — AB
SPECIFIC GRAVITY, URINE: 1.013 (ref 1.005–1.030)

## 2016-12-17 LAB — LACTIC ACID, PLASMA
LACTIC ACID, VENOUS: 2 mmol/L — AB (ref 0.5–1.9)
LACTIC ACID, VENOUS: 1.3 mmol/L (ref 0.5–1.9)

## 2016-12-17 LAB — CBC WITH DIFFERENTIAL/PLATELET
BASOS ABS: 0.1 10*3/uL (ref 0–0.1)
Basophils Relative: 1 %
EOS ABS: 0.1 10*3/uL (ref 0–0.7)
EOS PCT: 1 %
HCT: 34.6 % — ABNORMAL LOW (ref 35.0–47.0)
HEMOGLOBIN: 11.2 g/dL — AB (ref 12.0–16.0)
LYMPHS PCT: 10 %
Lymphs Abs: 1.1 10*3/uL (ref 1.0–3.6)
MCH: 29.1 pg (ref 26.0–34.0)
MCHC: 32.3 g/dL (ref 32.0–36.0)
MCV: 90.1 fL (ref 80.0–100.0)
Monocytes Absolute: 0.9 10*3/uL (ref 0.2–0.9)
Monocytes Relative: 7 %
NEUTROS PCT: 81 %
Neutro Abs: 9.6 10*3/uL — ABNORMAL HIGH (ref 1.4–6.5)
PLATELETS: 513 10*3/uL — AB (ref 150–440)
RBC: 3.84 MIL/uL (ref 3.80–5.20)
RDW: 15.2 % — ABNORMAL HIGH (ref 11.5–14.5)
WBC: 11.8 10*3/uL — AB (ref 3.6–11.0)

## 2016-12-17 LAB — CK: Total CK: 52 U/L (ref 38–234)

## 2016-12-17 MED ORDER — SODIUM CHLORIDE 0.9 % IV SOLN
INTRAVENOUS | Status: DC
  Administered 2016-12-17: 17:00:00 via INTRAVENOUS

## 2016-12-17 MED ORDER — ACETAMINOPHEN 500 MG PO TABS: 1000.0000 mg | ORAL_TABLET | Freq: Four times a day (QID) | ORAL | Status: DC | PRN

## 2016-12-17 MED ORDER — VANCOMYCIN HCL 10 G IV SOLR
1500.0000 mg | Freq: Three times a day (TID) | INTRAVENOUS | Status: DC
  Administered 2016-12-17 – 2016-12-18 (×3): 1500 mg via INTRAVENOUS
  Filled 2016-12-17 (×4): qty 1500

## 2016-12-17 MED ORDER — SODIUM CHLORIDE 0.9 % IV SOLN
3.0000 g | Freq: Once | INTRAVENOUS | Status: AC
  Administered 2016-12-17: 3 g via INTRAVENOUS
  Filled 2016-12-17: qty 3

## 2016-12-17 MED ORDER — ACETAMINOPHEN 650 MG RE SUPP: 650.0000 mg | Freq: Four times a day (QID) | RECTAL | Status: DC | PRN

## 2016-12-17 MED ORDER — ALBUTEROL SULFATE (2.5 MG/3ML) 0.083% IN NEBU: 2.5000 mg | INHALATION_SOLUTION | Freq: Four times a day (QID) | RESPIRATORY_TRACT | Status: DC | PRN

## 2016-12-17 MED ORDER — PANTOPRAZOLE SODIUM 40 MG PO TBEC
40.0000 mg | DELAYED_RELEASE_TABLET | Freq: Two times a day (BID) | ORAL | Status: DC
  Administered 2016-12-17 – 2016-12-22 (×10): 40 mg via ORAL
  Filled 2016-12-17 (×10): qty 1

## 2016-12-17 MED ORDER — MAGIC MOUTHWASH
10.0000 mL | Freq: Three times a day (TID) | ORAL | Status: DC
  Administered 2016-12-17 – 2016-12-22 (×14): 10 mL via ORAL
  Filled 2016-12-17 (×15): qty 10

## 2016-12-17 MED ORDER — ONDANSETRON HCL 4 MG/2ML IJ SOLN: 4.0000 mg | Freq: Four times a day (QID) | INTRAMUSCULAR | Status: DC | PRN

## 2016-12-17 MED ORDER — BUPROPION HCL ER (XL) 150 MG PO TB24
150.0000 mg | ORAL_TABLET | Freq: Two times a day (BID) | ORAL | Status: DC
  Administered 2016-12-17 – 2016-12-22 (×10): 150 mg via ORAL
  Filled 2016-12-17 (×10): qty 1

## 2016-12-17 MED ORDER — LORAZEPAM 1 MG PO TABS: 1.0000 mg | ORAL_TABLET | Freq: Two times a day (BID) | ORAL | Status: DC | PRN

## 2016-12-17 MED ORDER — SODIUM CHLORIDE 0.9 % IV SOLN
3.0000 g | Freq: Four times a day (QID) | INTRAVENOUS | Status: DC
  Administered 2016-12-17 – 2016-12-20 (×11): 3 g via INTRAVENOUS
  Filled 2016-12-17 (×13): qty 3

## 2016-12-17 MED ORDER — VANCOMYCIN HCL 10 G IV SOLR
1500.0000 mg | Freq: Once | INTRAVENOUS | Status: AC
  Administered 2016-12-17: 1500 mg via INTRAVENOUS
  Filled 2016-12-17: qty 1500

## 2016-12-17 MED ORDER — ONDANSETRON HCL 4 MG PO TABS: 4.0000 mg | ORAL_TABLET | Freq: Four times a day (QID) | ORAL | Status: DC | PRN

## 2016-12-17 MED ORDER — MONTELUKAST SODIUM 10 MG PO TABS
10.0000 mg | ORAL_TABLET | Freq: Every day | ORAL | Status: DC
  Administered 2016-12-17 – 2016-12-21 (×5): 10 mg via ORAL
  Filled 2016-12-17 (×5): qty 1

## 2016-12-17 MED ORDER — LEVOTHYROXINE SODIUM 50 MCG PO TABS
75.0000 ug | ORAL_TABLET | Freq: Every day | ORAL | Status: DC
  Administered 2016-12-17 – 2016-12-22 (×6): 75 ug via ORAL
  Filled 2016-12-17 (×7): qty 2

## 2016-12-17 MED ORDER — DIPHENHYDRAMINE HCL 25 MG PO TABS
25.0000 mg | ORAL_TABLET | Freq: Four times a day (QID) | ORAL | Status: DC | PRN
  Filled 2016-12-17: qty 1

## 2016-12-17 MED ORDER — LOSARTAN POTASSIUM 50 MG PO TABS
50.0000 mg | ORAL_TABLET | Freq: Every day | ORAL | Status: DC
  Administered 2016-12-17 – 2016-12-22 (×5): 50 mg via ORAL
  Filled 2016-12-17 (×6): qty 1

## 2016-12-17 MED ORDER — ENOXAPARIN SODIUM 40 MG/0.4ML ~~LOC~~ SOLN
40.0000 mg | Freq: Two times a day (BID) | SUBCUTANEOUS | Status: DC
  Filled 2016-12-17 (×6): qty 0.4

## 2016-12-17 MED ORDER — LORATADINE 10 MG PO TABS
10.0000 mg | ORAL_TABLET | Freq: Every day | ORAL | Status: DC
  Administered 2016-12-18 – 2016-12-21 (×3): 10 mg via ORAL
  Filled 2016-12-17 (×4): qty 1

## 2016-12-17 MED ORDER — ERGOCALCIFEROL 1.25 MG (50000 UT) PO CAPS: 50000.0000 [IU] | ORAL_CAPSULE | ORAL | Status: DC

## 2016-12-17 MED ORDER — ONDANSETRON 4 MG PO TBDP: 4.0000 mg | ORAL_TABLET | Freq: Three times a day (TID) | ORAL | Status: DC | PRN

## 2016-12-17 MED ORDER — ACETAMINOPHEN 325 MG PO TABS
650.0000 mg | ORAL_TABLET | Freq: Four times a day (QID) | ORAL | Status: DC | PRN
  Administered 2016-12-17 – 2016-12-19 (×3): 650 mg via ORAL
  Filled 2016-12-17 (×3): qty 2

## 2016-12-17 NOTE — Progress Notes (Signed)
Dr. Jerelyn Charles notified of patient's complaints of mouth thrush; acknowledged; new order written. Barbaraann Faster, RN 7:59 PM 12/17/2016

## 2016-12-17 NOTE — Progress Notes (Signed)
Dr. Jerelyn Charles notified of fever 102.2; already on Vancomycin and Unasyn; acknowledged; no new orders. Barbaraann Faster, RN 8:19 PM 12/17/2016

## 2016-12-17 NOTE — ED Notes (Signed)
Lab at bedside to get second set of blood cultures.

## 2016-12-17 NOTE — ED Triage Notes (Signed)
Patient presents to the ED after a follow-up with PCP for her cellulitis.  PCP told patient cellulitis did not appear to be improving and felt patient should come to ED for evaluation.  Patient states area of infection is on patient's left gluteal area.  Patient was seen in ED for the same on Sunday and patient was started on clindamycin.  Patient is in no obvious distress at this time.

## 2016-12-17 NOTE — Progress Notes (Addendum)
Pharmacy Antibiotic Note  Laura Nolan is a 46 y.o. female admitted on 12/17/2016 with cellulitis.  Pharmacy has been consulted for Unasyn and Vancomyin dosing.  Patient received one dose of vancomycin 1500mg  in ED.   Plan: Will initiate antibiotics as follows. Patient has had slight Scr bump with Scr reported at 0.97 on 11/19 and current Scr of 1.14. Pharmacy will continue to monitor and adjust as needed. Vancomycin trough will be drawn prior to fourth dose.  Vancomycin 1500 IV every 8 hours.  Goal trough 10-15 mcg/mL.  Unasyn 3g Q6H  Ke= 0.096  T1/2 = 7 hours Vd= 92.2L Cmin = 12 Stacked dose @ 6 hours    Height: 5\' 6"  (167.6 cm) Weight: (!) 530 lb (240.4 kg) IBW/kg (Calculated) : 59.3  Temp (24hrs), Avg:99.9 F (37.7 C), Min:99.7 F (37.6 C), Max:100.1 F (37.8 C)  Recent Labs  Lab 12/15/16 0042 12/17/16 1219 12/17/16 1547  WBC 9.5 11.8*  --   CREATININE 0.97 1.14*  --   LATICACIDVEN 1.4 2.0* 1.3    Estimated Creatinine Clearance: 129.6 mL/min (A) (by C-G formula based on SCr of 1.14 mg/dL (H)).    Allergies  Allergen Reactions  . Lisinopril Cough  . Macrodantin [Nitrofurantoin Macrocrystal] Itching  . Sulfa Antibiotics Itching and Swelling    Antimicrobials this admission: Vancomycin 11/21 >>   Unasyn 11/21 >>  Dose adjustments this admission:   Microbiology results: BCx: 11/21 >> pending UCx: 11/21 >> pending    Thank you for allowing pharmacy to be a part of this patient's care.  Lendon Ka, PharmD Pharmacy Resident 12/17/2016 4:39 PM

## 2016-12-17 NOTE — ED Provider Notes (Signed)
[facility] Emergency Department Provider Note   ____________________________________________   First MD Initiated Contact with Patient 12/17/16 1252     (approximate)  I have reviewed the triage vital signs and the nursing notes.   HISTORY  Chief Complaint Recurrent Skin Infections    HPI Laura Nolan is a 46 y.o. female Patient was seen on the 19th year for a cellulitis of the left buttocks. She went to see her PCP today and was sent back to the ER for further evaluation. Her left buttocks is probably about 2 feet in diameter the entirety of it is red and swollen and tender there is some dry crusting present but no bruising. Seems to have not gotten appreciably better from before. She still running a fever although here it's 99. She is no longer vomiting like she was.her white count today is higher than it was on the 19th and she has more polys and she did.   Past Medical History:  Diagnosis Date  . Arthralgia   . Chronic renal disease, stage 3, moderately decreased glomerular filtration rate (GFR) between 30-59 mL/min/1.73 square meter (HCC)   . Depression   . Elevated LFTs   . Endometrial cancer ([place])   . Endometrial cancer ([place])   . GERD (gastroesophageal reflux disease)   . Hypertension   . Morbid obesity ([place])   . Neuropathy due to chemotherapeutic drug ([place])   . Thyroid disease   . TMJ (dislocation of temporomandibular joint)   . Vitamin B12 deficiency   . Vitamin D deficiency     Patient Active Problem List   Diagnosis Date Noted  . Tonsillar abscess 11/11/2015    Past Surgical History:  Procedure Laterality Date  . CHOLECYSTECTOMY    . FRACTURE SURGERY    . NEPHRECTOMY Right     Prior to Admission medications   Medication Sig Start Date End Date Taking? Authorizing Provider  acetaminophen (TYLENOL) 500 MG tablet Take 1,000 mg by mouth every 6 (six) hours as needed for mild pain.    [provider]  albuterol (PROVENTIL  HFA;VENTOLIN HFA) 108 (90 Base) MCG/ACT inhaler Inhale 2 puffs into the lungs every 6 (six) hours as needed for wheezing or shortness of breath.    [provider]  buPROPion (WELLBUTRIN XL) 150 MG 24 hr tablet Take 150 mg by mouth 2 (two) times daily.    [provider]  clindamycin (CLEOCIN) 300 MG capsule Take 300 mg by mouth 4 (four) times daily.    [provider]  clindamycin (CLEOCIN) 300 MG capsule Take 1 capsule (300 mg total) 3 (three) times daily for 10 days by mouth. 12/15/16 12/25/16  Loney Hering, MD  diphenhydrAMINE (BENADRYL) 25 MG tablet Take 25 mg by mouth every 6 (six) hours as needed for allergies.    [provider]  ergocalciferol (VITAMIN D2) 50000 units capsule Take 50,000 Units by mouth once a week. Patient takes on Wednesday.    [provider]  levothyroxine (SYNTHROID, LEVOTHROID) 75 MCG tablet Take 75 mcg by mouth daily before breakfast.    [provider]  loratadine (CLARITIN) 10 MG tablet Take 10 mg by mouth daily.    [provider]  LORazepam (ATIVAN) 1 MG tablet Take 1 mg by mouth 2 (two) times daily as needed for anxiety (nausea).    [provider]  losartan (COZAAR) 50 MG tablet Take 50 mg by mouth daily.    [provider]  montelukast (SINGULAIR) 10  MG tablet Take 10 mg by mouth at bedtime.    [provider]  ondansetron (ZOFRAN ODT) 4 MG disintegrating tablet Take 1 tablet (4 mg total) every 8 (eight) hours as needed by mouth for nausea or vomiting. 12/15/16   Loney Hering, MD  pantoprazole (PROTONIX) 40 MG tablet Take 40 mg by mouth 2 (two) times daily.    [provider]  predniSONE (DELTASONE) 10 MG tablet Take 1 tablet (10 mg total) by mouth daily with breakfast. 50 mg PO (ORAL)  x 2 days 40 mg PO (ORAL)  x 2 days 30 mg PO  (ORAL)  x 2 days 20 mg PO  (ORAL) x 2 days 10 mg PO  (ORAL) x 2 days then stop 11/12/15   Bettey Costa, MD     Allergies Lisinopril; Macrodantin [nitrofurantoin macrocrystal]; and Sulfa antibiotics  Family History  Problem Relation Age of Onset  . Hypertension Mother   . Diabetes Father   . Hypertension Father     Social History Social History   Tobacco Use  . Smoking status: Never Smoker  . Smokeless tobacco: Never Used  Substance Use Topics  . Alcohol use: Yes  . Drug use: Not on file    Review of Systems  Constitutional:  fever Eyes: No visual changes. ENT: No sore throat. Cardiovascular: Denies chest pain. Respiratory: Denies shortness of breath. Gastrointestinal: No abdominal pain.  No nausea, no vomiting.  No diarrhea.  No constipation. Genitourinary: Negative for dysuria. Musculoskeletal: Negative for back pain. Skin: Negative for rash. Neurological: Negative for headaches, focal weakness   ____________________________________________   PHYSICAL EXAM:  VITAL SIGNS: ED Triage Vitals  Enc Vitals Group     BP 12/17/16 1221 136/73     Pulse Rate 12/17/16 1221 83     Resp 12/17/16 1221 18     Temp 12/17/16 1221 99.7 F (37.6 C)     Temp Source 12/17/16 1221 Oral     SpO2 12/17/16 1221 100 %     Weight 12/17/16 1221 (!) 530 lb (240.4 kg)     Height 12/17/16 1221 5\' 6"  (1.676 m)     Head Circumference --      Peak Flow --      Pain Score 12/17/16 1220 0     Pain Loc --      Pain Edu? --      Excl. in [place]? --     Constitutional: Alert and oriented. Well appearing and in no acute distress. Eyes: Conjunctivae are normal.  Head: Atraumatic. Nose: No congestion/rhinnorhea. Mouth/Throat: Mucous membranes are moist.  Oropharynx non-erythematous. Neck: No stridor.    Cardiovascular: Normal rate, regular rhythm. Grossly normal heart sounds.  Good peripheral circulation. Respiratory: Normal respiratory effort.  No retractions. Lungs CTAB. Gastrointestinal: Soft and nontender. No distention. No abdominal bruits. No CVA tenderness. Musculoskeletal: No lower  extremity tenderness nor edema.  left buttocks as described in history of present illness patient complains of a lot of tenderness Neurologic:  Normal speech and language. No gross focal neurologic deficits are appreciated. No gait instability. Skin:  Skin is warm, dry and intact. No rash noted. Psychiatric: Mood and affect are normal. Speech and behavior are normal.  ____________________________________________   LABS (all labs ordered are listed, but only abnormal results are displayed)  Labs Reviewed  COMPREHENSIVE METABOLIC PANEL - Abnormal; Notable for the following components:      Result Value   Chloride 99 (*)    Glucose, Bld 107 (*)  Creatinine, Ser 1.14 (*)    Calcium 8.4 (*)    Albumin 2.6 (*)    AST 54 (*)    ALT 62 (*)    Total Bilirubin 2.1 (*)    GFR calc non Af Amer 57 (*)    All other components within normal limits  CBC WITH DIFFERENTIAL/PLATELET - Abnormal; Notable for the following components:   WBC 11.8 (*)    Hemoglobin 11.2 (*)    HCT 34.6 (*)    RDW 15.2 (*)    Platelets 513 (*)    Neutro Abs 9.6 (*)    All other components within normal limits  LACTIC ACID, PLASMA  LACTIC ACID, PLASMA   ____________________________________________  EKG   ____________________________________________  RADIOLOGY   ____________________________________________   PROCEDURES  Procedure(s) performed:   Procedures  Critical Care performed:  ____________________________________________   INITIAL IMPRESSION / ASSESSMENT AND PLAN / ED COURSE  old records were reviewed especially for the last visit. Patient's fever somewhat better but her white count is entirely redness seems to be more diffuse covering a larger area. The edema is the same i.e. more diffuse covering a larger area. The patient does not appear to have succeeded in the outpatient management of her cellulitis. We'll recommend admission.       ____________________________________________   FINAL CLINICAL IMPRESSION(S) / ED DIAGNOSES  Final diagnoses:  Cellulitis of buttock     ED Discharge Orders    None       Note:  This document was prepared using Dragon voice recognition software and may include unintentional dictation errors.    Nena Polio, MD 12/17/16 1316

## 2016-12-17 NOTE — Progress Notes (Signed)
On-call Hospitalist paged for fever 102.2; awaiting callback. Barbaraann Faster, RN; [phone]; 12/17/2016

## 2016-12-17 NOTE — ED Notes (Signed)
Patient states she was seen in the ED on Sunday, went to [facility] this AM and advised to return to hospital.

## 2016-12-17 NOTE — H&P (Signed)
[place] at [place] NAME: Laura Nolan    MR#:  [id number]  DATE OF BIRTH:  August 10, 1970  DATE OF ADMISSION:  12/17/2016  PRIMARY CARE PHYSICIAN: Sharyne Peach, MD   REQUESTING/REFERRING PHYSICIAN: Conni Slipper MD  CHIEF COMPLAINT:   Chief Complaint  Patient presents with  . Recurrent Skin Infections    HISTORY OF PRESENT ILLNESS: Laura Nolan  is a 46 y.o. female with a known history of morbid obesity, depression, elevated LFTs, GERD, essential hypertension, hypothyroidism who was seen in the emergency room on the 19th with area of left buttocks erythema and cellulitis at that time had an ultrasound of that area showed no evidence of abscess she was discharged home on oral clindamycin however her symptoms continue to progress worse.  She was seen by her primary care provider today who referred her to the ED.  Now the area on the left buttocks has gotten much larger there is more erythema and warmth noted.  She states that she did have some drainage from that area before but none today.  Patient also has felt feverish but has not checked her temperature. She also complains of pain in that area due to the inflammation. Complains of oral thrush and sore throat.     PAST MEDICAL HISTORY:   Past Medical History:  Diagnosis Date  . Arthralgia   . Chronic renal disease, stage 3, moderately decreased glomerular filtration rate (GFR) between 30-59 mL/min/1.73 square meter (HCC)   . Depression   . Elevated LFTs   . Endometrial cancer ([place])   . Endometrial cancer ([place])   . GERD (gastroesophageal reflux disease)   . Hypertension   . Morbid obesity ([place])   . Neuropathy due to chemotherapeutic drug ([place])   . Thyroid disease   . TMJ (dislocation of temporomandibular joint)   . Vitamin B12 deficiency   . Vitamin D deficiency     PAST SURGICAL HISTORY:  Past Surgical History:  Procedure Laterality Date  . CHOLECYSTECTOMY    . FRACTURE SURGERY     . NEPHRECTOMY Right     SOCIAL HISTORY:  Social History   Tobacco Use  . Smoking status: Never Smoker  . Smokeless tobacco: Never Used  Substance Use Topics  . Alcohol use: Yes    FAMILY HISTORY:  Family History  Problem Relation Age of Onset  . Hypertension Mother   . Diabetes Father   . Hypertension Father     DRUG ALLERGIES:  Allergies  Allergen Reactions  . Lisinopril Cough  . Macrodantin [Nitrofurantoin Macrocrystal] Itching  . Sulfa Antibiotics Itching and Swelling    REVIEW OF SYSTEMS:   CONSTITUTIONAL: No fever, fatigue or weakness.  EYES: No blurred or double vision.  EARS, NOSE, AND THROAT: No tinnitus or ear pain.  Positive sore throat RESPIRATORY: No cough, shortness of breath, wheezing or hemoptysis.  CARDIOVASCULAR: No chest pain, orthopnea, edema.  GASTROINTESTINAL: No nausea, vomiting, diarrhea or abdominal pain.  GENITOURINARY: No dysuria, hematuria.  ENDOCRINE: No polyuria, nocturia,  HEMATOLOGY: No anemia, easy bruising or bleeding SKIN: Left buttocks area redness MUSCULOSKELETAL: No joint pain or arthritis.   NEUROLOGIC: No tingling, numbness, weakness.  PSYCHIATRY: No anxiety or depression.   MEDICATIONS AT HOME:  Prior to Admission medications   Medication Sig Start Date End Date Taking? Authorizing Provider  buPROPion (WELLBUTRIN XL) 150 MG 24 hr tablet Take 150 mg by mouth 2 (two) times daily.   Yes [provider]  clindamycin (  CLEOCIN) 300 MG capsule Take 1 capsule (300 mg total) 3 (three) times daily for 10 days by mouth. 12/15/16 12/25/16 Yes Webster, Eduard Roux, MD  ergocalciferol (VITAMIN D2) 50000 units capsule Take 50,000 Units by mouth once a week. Patient takes on Wednesday.   Yes [provider]  levothyroxine (SYNTHROID, LEVOTHROID) 75 MCG tablet Take 75 mcg by mouth daily before breakfast.   Yes [provider]  losartan (COZAAR) 50 MG tablet Take 50 mg by mouth daily.   Yes [provider]   ondansetron (ZOFRAN ODT) 4 MG disintegrating tablet Take 1 tablet (4 mg total) every 8 (eight) hours as needed by mouth for nausea or vomiting. 12/15/16  Yes Loney Hering, MD  acetaminophen (TYLENOL) 500 MG tablet Take 1,000 mg by mouth every 6 (six) hours as needed for mild pain.    [provider]  albuterol (PROVENTIL HFA;VENTOLIN HFA) 108 (90 Base) MCG/ACT inhaler Inhale 2 puffs into the lungs every 6 (six) hours as needed for wheezing or shortness of breath.    [provider]  diphenhydrAMINE (BENADRYL) 25 MG tablet Take 25 mg by mouth every 6 (six) hours as needed for allergies.    [provider]      PHYSICAL EXAMINATION:   VITAL SIGNS: Blood pressure 140/66, pulse 84, temperature 99.7 F (37.6 C), temperature source Oral, resp. rate (!) 21, height 5\' 6"  (1.676 m), weight (!) 530 lb (240.4 kg), SpO2 99 %.  GENERAL:  46 y.o.-year-old patient lying in the bed with no acute distress.  Morbidly obese EYES: Pupils equal, round, reactive to light and accommodation. No scleral icterus. Extraocular muscles intact.  HEENT: Head atraumatic, normocephalic. Oropharynx and nasopharynx clear.  NECK:  Supple, no jugular venous distention. No thyroid enlargement, no tenderness.  LUNGS: Normal breath sounds bilaterally, no wheezing, rales,rhonchi or crepitation. No use of accessory muscles of respiration.  CARDIOVASCULAR: S1, S2 normal. No murmurs, rubs, or gallops.  ABDOMEN: Soft, nontender, nondistended. Bowel sounds present. No organomegaly or mass.  EXTREMITIES: No pedal edema, cyanosis, or clubbing.  NEUROLOGIC: Cranial nerves II through XII are intact. Muscle strength 5/5 in all extremities. Sensation intact. Gait not checked.  PSYCHIATRIC: The patient is alert and oriented x 3.  SKIN: Patient has large erythema involving her left buttocks region.  Currently there is no drainage is is warm to touch LABORATORY PANEL:   CBC Recent Labs  Lab 12/15/16 0042  12/17/16 1219  WBC 9.5 11.8*  HGB 12.3 11.2*  HCT 37.3 34.6*  PLT 370 513*  MCV 89.0 90.1  MCH 29.4 29.1  MCHC 33.1 32.3  RDW 14.9* 15.2*  LYMPHSABS 1.0 1.1  MONOABS 1.1* 0.9  EOSABS 0.1 0.1  BASOSABS 0.1 0.1   ------------------------------------------------------------------------------------------------------------------  Chemistries  Recent Labs  Lab 12/15/16 0042 12/17/16 1219  NA 135 136  K 3.6 4.2  CL 101 99*  CO2 21* 25  GLUCOSE 124* 107*  BUN 12 11  CREATININE 0.97 1.14*  CALCIUM 8.4* 8.4*  AST 30 54*  ALT 31 62*  ALKPHOS 85 91  BILITOT 1.6* 2.1*   ------------------------------------------------------------------------------------------------------------------ estimated creatinine clearance is 129.6 mL/min (A) (by C-G formula based on SCr of 1.14 mg/dL (H)). ------------------------------------------------------------------------------------------------------------------ No results for input(s): TSH, T4TOTAL, T3FREE, THYROIDAB in the last 72 hours.  Invalid input(s): FREET3   Coagulation profile No results for input(s): INR, PROTIME in the last 168 hours. ------------------------------------------------------------------------------------------------------------------- No results for input(s): DDIMER in the last 72 hours. -------------------------------------------------------------------------------------------------------------------  Cardiac Enzymes No results for  input(s): CKMB, TROPONINI, MYOGLOBIN in the last 168 hours.  Invalid input(s): CK ------------------------------------------------------------------------------------------------------------------ Invalid input(s): POCBNP  ---------------------------------------------------------------------------------------------------------------  Urinalysis No results found for: COLORURINE, APPEARANCEUR, LABSPEC, PHURINE, GLUCOSEU, HGBUR, BILIRUBINUR, KETONESUR, PROTEINUR, UROBILINOGEN, NITRITE,  LEUKOCYTESUR   RADIOLOGY: No results found.  EKG: Orders placed or performed during the hospital encounter of 10/05/16  . EKG 12-Lead  . EKG 12-Lead  . EKG    IMPRESSION AND PLAN: Patient is a 46 year old morbidly obese female presenting with complaint of worsening left buttock cellulitis  1.  Left buttock cellulitis At this point we will treat with IV vancomycin and Unasyn Patient's body habitus prevents [place] from doing any CT scan imaging but if there is no improvement will need ultrasound again to make sure there is no abscess  2.  Hypothyroidism continue Synthroid  3.  Essential hypertension continue losartan  4.  Acute kidney injury which is very mild I will give her some fluids Patient feels that she is lead little dehydrated  5.  Morbid obesity weight loss strongly recommended  6.  Miscellaneous Lovenox for DVT prophylaxis    All the records are reviewed and case discussed with ED provider. Management plans discussed with the patient, family and they are in agreement.  CODE STATUS: Code Status History    Date Active Date Inactive Code Status Order ID Comments User Context   11/11/2015 06:29 11/12/2015 13:43 Full Code [id number]  Saundra Shelling, MD Inpatient       TOTAL TIME TAKING CARE OF THIS PATIENT:55 minutes.    Dustin Flock M.D on 12/17/2016 at 2:20 PM  Between 7am to 6pm - Pager - [phone]  After 6pm go to [email or web address] - password [email or web address] [facility]  Office  [phone]  CC: Primary care physician; Sharyne Peach, MD

## 2016-12-18 LAB — BASIC METABOLIC PANEL
Anion gap: 7 (ref 5–15)
BUN: 10 mg/dL (ref 6–20)
CHLORIDE: 103 mmol/L (ref 101–111)
CO2: 27 mmol/L (ref 22–32)
Calcium: 7.9 mg/dL — ABNORMAL LOW (ref 8.9–10.3)
Creatinine, Ser: 0.94 mg/dL (ref 0.44–1.00)
GFR calc non Af Amer: >60 mL/min (ref >60)
Glucose, Bld: 102 mg/dL — ABNORMAL HIGH (ref 65–99)
POTASSIUM: 4.1 mmol/L (ref 3.5–5.1)
SODIUM: 137 mmol/L (ref 135–145)

## 2016-12-18 LAB — CBC
HEMATOCRIT: 29.5 % — AB (ref 35.0–47.0)
HEMOGLOBIN: 9.9 g/dL — AB (ref 12.0–16.0)
MCH: 30.2 pg (ref 26.0–34.0)
MCHC: 33.5 g/dL (ref 32.0–36.0)
MCV: 90.2 fL (ref 80.0–100.0)
Platelets: 426 10*3/uL (ref 150–440)
RBC: 3.28 MIL/uL — AB (ref 3.80–5.20)
RDW: 15.3 % — ABNORMAL HIGH (ref 11.5–14.5)
WBC: 9 10*3/uL (ref 3.6–11.0)

## 2016-12-18 LAB — VANCOMYCIN, TROUGH: VANCOMYCIN TR: 20 ug/mL (ref 15–20)

## 2016-12-18 MED ORDER — VANCOMYCIN HCL 10 G IV SOLR
1500.0000 mg | Freq: Two times a day (BID) | INTRAVENOUS | Status: DC
  Administered 2016-12-19 – 2016-12-22 (×6): 1500 mg via INTRAVENOUS
  Filled 2016-12-18 (×8): qty 1500

## 2016-12-18 NOTE — Progress Notes (Signed)
[place] at [place] NAME: Laura Nolan    MR#:  [id number]  DATE OF BIRTH:  1970/12/20  SUBJECTIVE:   Patient presented with increasing redness over her left buttock.  She was seen in the emergency room on 12/15/2016.  Ultrasound did not show any abscess.  She had fever 102.2 yesterday.  Fever 99.9 today REVIEW OF SYSTEMS:   Review of Systems  Constitutional: Positive for fever. Negative for chills and weight loss.  HENT: Negative for ear discharge, ear pain and nosebleeds.   Eyes: Negative for blurred vision, pain and discharge.  Respiratory: Negative for sputum production, shortness of breath, wheezing and stridor.   Cardiovascular: Negative for chest pain, palpitations, orthopnea and PND.  Gastrointestinal: Negative for abdominal pain, diarrhea, nausea and vomiting.  Genitourinary: Negative for frequency and urgency.  Musculoskeletal: Negative for back pain and joint pain.  Skin: Positive for rash.       Left buttock  Neurological: Positive for weakness. Negative for sensory change, speech change and focal weakness.  Psychiatric/Behavioral: Negative for depression and hallucinations. The patient is not nervous/anxious.    Tolerating Diet:yes Tolerating: ambulatory  DRUG ALLERGIES:   Allergies  Allergen Reactions  . Lisinopril Cough  . Macrodantin [Nitrofurantoin Macrocrystal] Itching  . Sulfa Antibiotics Itching and Swelling    VITALS:  Blood pressure (!) 137/55, pulse 79, temperature 99.5 F (37.5 C), temperature source Oral, resp. rate 18, height 5\' 6"  (1.676 m), weight (!) 240.4 kg (530 lb), SpO2 100 %.  PHYSICAL EXAMINATION:   Physical Exam  GENERAL:  46 y.o.-year-old patient lying in the bed with no acute distress.  Severe morbid obesity EYES: Pupils equal, round, reactive to light and accommodation. No scleral icterus. Extraocular muscles intact.  HEENT: Head atraumatic, normocephalic. Oropharynx and nasopharynx  clear.  NECK:  Supple, no jugular venous distention. No thyroid enlargement, no tenderness.  LUNGS: Normal breath sounds bilaterally, no wheezing, rales, rhonchi. No use of accessory muscles of respiration.  CARDIOVASCULAR: S1, S2 normal. No murmurs, rubs, or gallops.  ABDOMEN: Soft, nontender, nondistended. Bowel sounds present. No organomegaly or mass.  EXTREMITIES: No cyanosis, clubbing or edema b/l.    NEUROLOGIC: Cranial nerves II through XII are intact. No focal Motor or sensory deficits b/l.   PSYCHIATRIC:  patient is alert and oriented x 3.  SKIN: No obvious rash, lesion, or ulcer.  Patient has significant cellulitis over his lower severely morbid obese left buttock which has been marked with a skin marker.  She has some skin tears/stage I ulcers over the left buttock.  LABORATORY PANEL:  CBC Recent Labs  Lab 12/18/16 0507  WBC 9.0  HGB 9.9*  HCT 29.5*  PLT 426    Chemistries  Recent Labs  Lab 12/17/16 1219 12/18/16 0507  NA 136 137  K 4.2 4.1  CL 99* 103  CO2 25 27  GLUCOSE 107* 102*  BUN 11 10  CREATININE 1.14* 0.94  CALCIUM 8.4* 7.9*  AST 54*  --   ALT 62*  --   ALKPHOS 91  --   BILITOT 2.1*  --    Cardiac Enzymes No results for input(s): TROPONINI in the last 168 hours. RADIOLOGY:  No results found. ASSESSMENT AND PLAN:   Laura Nolan  is a 46 y.o. female with a known history of morbid obesity, depression, elevated LFTs, GERD, essential hypertension, hypothyroidism who was seen in the emergency room on the 19th with area of left buttocks erythema and cellulitis at  that time had an ultrasound of that area showed no evidence of abscess she was discharged home on oral clindamycin however her symptoms continue to progress worse  1.  Left buttock cellulitis Cont IV vancomycin and Unasyn Patient's body habitus prevents [place] from doing any CT scan imaging but if there is no improvement will need ultrasound again to make sure there is no abscess  2.   Hypothyroidism continue Synthroid  3.  Essential hypertension continue losartan  4.  Acute kidney injury which is very mild I will give her some fluids Patient feels that she is lead little dehydrated  5.  Morbid obesity weight loss strongly recommended  6.  Miscellaneous Lovenox for DVT prophylaxis    Case discussed with Care Management/Social Worker. Management plans discussed with the patient, family and they are in agreement.  CODE STATUS: full  DVT Prophylaxis: lovenonx  TOTAL TIME TAKING CARE OF THIS PATIENT: 30 minutes.  >50% time spent on counselling and coordination of care  POSSIBLE D/C IN 1-2 DAYS, DEPENDING ON CLINICAL CONDITION.  Note: This dictation was prepared with Dragon dictation along with smaller phrase technology. Any transcriptional errors that result from this process are unintentional.  Fritzi Mandes M.D on 12/18/2016 at 12:34 PM  Between 7am to 6pm - Pager - [phone]  After 6pm go to [email or web address] - password [email or web address] [facility]  Office  [phone]  CC: Primary care physician; Sharyne Peach, MD

## 2016-12-18 NOTE — Progress Notes (Signed)
Pharmacy Antibiotic Note  Laura Nolan is a 46 y.o. female admitted on 12/17/2016 with cellulitis.  Pharmacy has been consulted for Unasyn and Vancomyin dosing.  Patient received one dose of vancomycin 1500mg  in ED.   Plan: Will initiate antibiotics as follows. Patient has had slight Scr bump with Scr reported at 0.97 on 11/19 and current Scr of 1.14. Pharmacy will continue to monitor and adjust as needed. Vancomycin trough will be drawn prior to fourth dose.  Vancomycin 1500 IV every 8 hours.  Goal trough 10-15 mcg/mL.  Unasyn 3g Q6H  Ke= 0.096  T1/2 = 7 hours Vd= 92.2L Cmin = 12 Stacked dose @ 6 hours   ADD: Vancomycin level resulted @ 20 (~7.5 hours starting to be infused or 5.5 hours after the end of the 2 hour infusion). Will decrease Vancomycin dosing frequency from q8 hour dosing to q12 hour dosing and will recheck level prior to the 1700 dose of Vancomycin on 11/24.    Height: 5\' 6"  (167.6 cm) Weight: (!) 530 lb (240.4 kg) IBW/kg (Calculated) : 59.3  Temp (24hrs), Avg:100.3 F (37.9 C), Min:99.5 F (37.5 C), Max:102.2 F (39 C)  Recent Labs  Lab 12/15/16 0042 12/17/16 1219 12/17/16 1547 12/18/16 0507 12/18/16 1200  WBC 9.5 11.8*  --  9.0  --   CREATININE 0.97 1.14*  --  0.94  --   LATICACIDVEN 1.4 2.0* 1.3  --   --   VANCOTROUGH  --   --   --   --  20    Estimated Creatinine Clearance: 157.1 mL/min (by C-G formula based on SCr of 0.94 mg/dL).    Allergies  Allergen Reactions  . Lisinopril Cough  . Macrodantin [Nitrofurantoin Macrocrystal] Itching  . Sulfa Antibiotics Itching and Swelling    Antimicrobials this admission: Vancomycin 11/21 >>   Unasyn 11/21 >>  Dose adjustments this admission:   Microbiology results: BCx: 11/21 >> pending UCx: 11/21 >> pending    Thank you for allowing pharmacy to be a part of this patient's care.  Larene Beach, PharmD  12/18/2016 1:14 PM

## 2016-12-19 ENCOUNTER — Inpatient Hospital Stay: Payer: Commercial Managed Care - HMO

## 2016-12-19 DIAGNOSIS — L8990 Pressure ulcer of unspecified site, unspecified stage: Secondary | ICD-10-CM

## 2016-12-19 MED ORDER — CLINDAMYCIN HCL 150 MG PO CAPS
600.0000 mg | ORAL_CAPSULE | Freq: Three times a day (TID) | ORAL | Status: DC
  Administered 2016-12-19 – 2016-12-22 (×9): 600 mg via ORAL
  Filled 2016-12-19 (×10): qty 4

## 2016-12-19 NOTE — Progress Notes (Signed)
[place] at [place] NAME: Laura Nolan    MR#:  [id number]  DATE OF BIRTH:  [birth year]  SUBJECTIVE:   Patient presented with increasing redness over her left buttock.  She was seen in the emergency room on 12/15/2016.  Ultrasound did not show any abscess.  She had fever 102.2 yesterday. And 101.2  Fever 99.2 today REVIEW OF SYSTEMS:   Review of Systems  Constitutional: Positive for fever. Negative for chills and weight loss.  HENT: Negative for ear discharge, ear pain and nosebleeds.   Eyes: Negative for blurred vision, pain and discharge.  Respiratory: Negative for sputum production, shortness of breath, wheezing and stridor.   Cardiovascular: Negative for chest pain, palpitations, orthopnea and PND.  Gastrointestinal: Negative for abdominal pain, diarrhea, nausea and vomiting.  Genitourinary: Negative for frequency and urgency.  Musculoskeletal: Negative for back pain and joint pain.  Skin: Positive for rash.       Left buttock  Neurological: Positive for weakness. Negative for sensory change, speech change and focal weakness.  Psychiatric/Behavioral: Negative for depression and hallucinations. The patient is not nervous/anxious.    Tolerating Diet:yes Tolerating: ambulatory  DRUG ALLERGIES:   Allergies  Allergen Reactions  . Lisinopril Cough  . Macrodantin [Nitrofurantoin Macrocrystal] Itching  . Sulfa Antibiotics Itching and Swelling    VITALS:  Blood pressure (!) 113/50, pulse 85, temperature 99.2 F (37.3 C), temperature source Oral, resp. rate 20, height 5\' 6"  (1.676 m), weight (!) 240.4 kg (530 lb), SpO2 99 %.  PHYSICAL EXAMINATION:   Physical Exam  GENERAL:  46 y.o.-year-old patient lying in the bed with no acute distress.  Severe morbid obesity EYES: Pupils equal, round, reactive to light and accommodation. No scleral icterus. Extraocular muscles intact.  HEENT: Head atraumatic, normocephalic. Oropharynx and  nasopharynx clear.  NECK:  Supple, no jugular venous distention. No thyroid enlargement, no tenderness.  LUNGS: Normal breath sounds bilaterally, no wheezing, rales, rhonchi. No use of accessory muscles of respiration.  CARDIOVASCULAR: S1, S2 normal. No murmurs, rubs, or gallops.  ABDOMEN: Soft, nontender, nondistended. Bowel sounds present. No organomegaly or mass.  EXTREMITIES: No cyanosis, clubbing or edema b/l.    NEUROLOGIC: Cranial nerves II through XII are intact. No focal Motor or sensory deficits b/l.   PSYCHIATRIC:  patient is alert and oriented x 3.  SKIN: No obvious rash, lesion, or ulcer.  Patient has significant cellulitis over her lower severely morbid obese left buttock which has been marked with a skin marker.  She has some skin tears/stage I ulcers over the left buttock.  LABORATORY PANEL:  CBC Recent Labs  Lab 12/18/16 0507  WBC 9.0  HGB 9.9*  HCT 29.5*  PLT 426    Chemistries  Recent Labs  Lab 12/17/16 1219 12/18/16 0507  NA 136 137  K 4.2 4.1  CL 99* 103  CO2 25 27  GLUCOSE 107* 102*  BUN 11 10  CREATININE 1.14* 0.94  CALCIUM 8.4* 7.9*  AST 54*  --   ALT 62*  --   ALKPHOS 91  --   BILITOT 2.1*  --    Cardiac Enzymes No results for input(s): TROPONINI in the last 168 hours. RADIOLOGY:  No results found. ASSESSMENT AND PLAN:   Laura Nolan  is a 46 y.o. female with a known history of morbid obesity, depression, elevated LFTs, GERD, essential hypertension, hypothyroidism who was seen in the emergency room on the 19th with area of left buttocks erythema and  cellulitis at that time had an ultrasound of that area showed no evidence of abscess she was discharged home on oral clindamycin however her symptoms continue to progress worse  1.  Left buttock cellulitis -Cont IV vancomycin and Unasyn -Patient's body habitus prevents [place] from doing any CT scan imaging but since there is no improvement will need ultrasound again to make sure there is no  abscess -still spiking high grade fever -ID consult placed (no coverage today)  2.  Hypothyroidism continue Synthroid  3.  Essential hypertension continue losartan  4.  Acute kidney injury which is very mild -received IVF  5.  Morbid obesity weight loss strongly recommended  6.  Miscellaneous Lovenox for DVT prophylaxis   Case discussed with Care Management/Social Worker. Management plans discussed with the patient and they are in agreement.  CODE STATUS: full  DVT Prophylaxis: lovenonx  TOTAL TIME TAKING CARE OF THIS PATIENT: 30 minutes.  >50% time spent on counselling and coordination of care  POSSIBLE D/C IN 1-2 DAYS, DEPENDING ON CLINICAL CONDITION.  Note: This dictation was prepared with Dragon dictation along with smaller phrase technology. Any transcriptional errors that result from this process are unintentional.  Fritzi Mandes M.D on 12/19/2016 at 8:51 AM  Between 7am to 6pm - Pager - [phone]  After 6pm go to [email or web address] - password [email or web address] [facility]  Office  [phone]  CC: Primary care physician; Sharyne Peach, MD

## 2016-12-19 NOTE — Progress Notes (Signed)
Spoke with Dr hatcher ID at [facility]. Presented the case.  Recommends add Clindamycin to see how she does. Given his large area of cellulitis it likley will take a few more days to get better

## 2016-12-19 NOTE — Progress Notes (Signed)
Pharmacy Antibiotic Note  Laura Nolan is a 46 y.o. female admitted on 12/17/2016 with cellulitis.  Pharmacy has been consulted for Unasyn and Vancomyin dosing.  Patient received one dose of vancomycin 1500mg  in ED.   Plan: Will initiate antibiotics as follows. Patient has had slight Scr bump with Scr reported at 0.97 on 11/19 and current Scr of 1.14. Pharmacy will continue to monitor and adjust as needed. Vancomycin trough will be drawn prior to fourth dose.  Vancomycin 1500 IV every 8 hours.  Goal trough 10-15 mcg/mL.  Unasyn 3g Q6H  Ke= 0.096  T1/2 = 7 hours Vd= 92.2L Cmin = 12 Stacked dose @ 6 hours  ADD: Vancomycin level resulted @ 20 (~7.5 hours starting to be infused or 5.5 hours after the end of the 2 hour infusion). Will decrease Vancomycin dosing frequency from q8 hour dosing to q12 hour dosing and will recheck level prior to the 1700 dose of Vancomycin on 11/24.   11/23: Ultrasound= no abscess. Urine Cx= E.coli. F/u Vancomycin trough/Scr tomorrow.  Wt= 240 kg  BMI 85. Watch for accumulation in obese patient.    Height: 5\' 6"  (167.6 cm) Weight: (!) 530 lb (240.4 kg) IBW/kg (Calculated) : 59.3  Temp (24hrs), Avg:100.5 F (38.1 C), Min:99.2 F (37.3 C), Max:102.2 F (39 C)  Recent Labs  Lab 12/15/16 0042 12/17/16 1219 12/17/16 1547 12/18/16 0507 12/18/16 1200  WBC 9.5 11.8*  --  9.0  --   CREATININE 0.97 1.14*  --  0.94  --   LATICACIDVEN 1.4 2.0* 1.3  --   --   VANCOTROUGH  --   --   --   --  20    Estimated Creatinine Clearance: 157.1 mL/min (by C-G formula based on SCr of 0.94 mg/dL).    Allergies  Allergen Reactions  . Lisinopril Cough  . Macrodantin [Nitrofurantoin Macrocrystal] Itching  . Sulfa Antibiotics Itching and Swelling    Antimicrobials this admission: Vancomycin 11/21 >>   Unasyn 11/21 >>  Dose adjustments this admission:   Microbiology results: BCx: 11/21 >> pending UCx: 11/21 >>E.coli   Chinita Greenland PharmD Clinical  Pharmacist 12/19/2016

## 2016-12-20 LAB — URINE CULTURE: Culture: >=100000 — AB

## 2016-12-20 LAB — VANCOMYCIN, TROUGH: Vancomycin Tr: 16 ug/mL (ref 15–20)

## 2016-12-20 LAB — CREATININE, SERUM
Creatinine, Ser: 0.91 mg/dL (ref 0.44–1.00)
GFR calc Af Amer: >60 mL/min (ref >60)

## 2016-12-20 MED ORDER — CEFAZOLIN SODIUM-DEXTROSE 1-4 GM/50ML-% IV SOLN
1.0000 g | Freq: Two times a day (BID) | INTRAVENOUS | Status: DC
  Filled 2016-12-20 (×2): qty 50

## 2016-12-20 MED ORDER — DEXTROSE 5 % IV SOLN
Freq: Two times a day (BID) | INTRAVENOUS | Status: DC
  Administered 2016-12-20 – 2016-12-22 (×4): via INTRAVENOUS
  Filled 2016-12-20 (×6): qty 10

## 2016-12-20 NOTE — Progress Notes (Signed)
[facility] [place] NAME: Laura Nolan    MR#:  [id number]  DATE OF BIRTH:  May 29, 1970  SUBJECTIVE:   No high-grade fever so far today. REVIEW OF SYSTEMS:   Review of Systems  Constitutional: Positive for fever. Negative for chills and weight loss.  HENT: Negative for ear discharge, ear pain and nosebleeds.   Eyes: Negative for blurred vision, pain and discharge.  Respiratory: Negative for sputum production, shortness of breath, wheezing and stridor.   Cardiovascular: Negative for chest pain, palpitations, orthopnea and PND.  Gastrointestinal: Negative for abdominal pain, diarrhea, nausea and vomiting.  Genitourinary: Negative for frequency and urgency.  Musculoskeletal: Negative for back pain and joint pain.  Skin: Positive for rash.       Left buttock  Neurological: Positive for weakness. Negative for sensory change, speech change and focal weakness.  Psychiatric/Behavioral: Negative for depression and hallucinations. The patient is not nervous/anxious.    Tolerating Diet:yes Tolerating: ambulatory  DRUG ALLERGIES:   Allergies  Allergen Reactions  . Lisinopril Cough  . Macrodantin [Nitrofurantoin Macrocrystal] Itching  . Sulfa Antibiotics Itching and Swelling    VITALS:  Blood pressure (!) 106/56, pulse 81, temperature 99.4 F (37.4 C), temperature source Oral, resp. rate 16, height 5\' 6"  (1.676 m), weight (!) 240.4 kg (530 lb), SpO2 100 %.  PHYSICAL EXAMINATION:   Physical Exam  GENERAL:  46 y.o.-year-old patient lying in the bed with no acute distress.  Severe morbid obesity EYES: Pupils equal, round, reactive to light and accommodation. No scleral icterus. Extraocular muscles intact.  HEENT: Head atraumatic, normocephalic. Oropharynx and nasopharynx clear.  NECK:  Supple, no jugular venous distention. No thyroid enlargement, no tenderness.  LUNGS: Normal breath sounds bilaterally, no wheezing, rales, rhonchi. No  use of accessory muscles of respiration.  CARDIOVASCULAR: S1, S2 normal. No murmurs, rubs, or gallops.  ABDOMEN: Soft, nontender, nondistended. Bowel sounds present. No organomegaly or mass.  EXTREMITIES: No cyanosis, clubbing or edema b/l.    NEUROLOGIC: Cranial nerves II through XII are intact. No focal Motor or sensory deficits b/l.   PSYCHIATRIC:  patient is alert and oriented x 3.  SKIN: No obvious rash, lesion, or ulcer.  Patient has significant cellulitis over her lower severely morbid obese left buttock which has been marked with a skin marker.  She has some skin tears/stage I ulcers over the left buttock.  LABORATORY PANEL:  CBC Recent Labs  Lab 12/18/16 0507  WBC 9.0  HGB 9.9*  HCT 29.5*  PLT 426    Chemistries  Recent Labs  Lab 12/17/16 1219 12/18/16 0507 12/20/16 0518  NA 136 137  --   K 4.2 4.1  --   CL 99* 103  --   CO2 25 27  --   GLUCOSE 107* 102*  --   BUN 11 10  --   CREATININE 1.14* 0.94 0.91  CALCIUM 8.4* 7.9*  --   AST 54*  --   --   ALT 62*  --   --   ALKPHOS 91  --   --   BILITOT 2.1*  --   --    Cardiac Enzymes No results for input(s): TROPONINI in the last 168 hours. RADIOLOGY:  US Pelvis Limited (transabdominal Only)  Result Date: 12/19/2016 CLINICAL DATA:  Cellulitis left buttock. Fever. Evaluation for abscess . EXAM: LIMITED ULTRASOUND OF PELVIS TECHNIQUE: Limited transabdominal ultrasound examination of the pelvis was performed. COMPARISON:  Ultrasound 12/14/2016. FINDINGS: Extensive soft tissue  edema. No focal fluid collection noted to suggest abscess. No solid mass lesion. IMPRESSION: Diffuse edema noted the region of clinical concern. This consistent with a history of cellulitis. No abscess identified. Electronically Signed   By: Marcello Moores  Register   On: 12/19/2016 10:29   ASSESSMENT AND PLAN:   Laura Nolan  is a 46 y.o. female with a known history of morbid obesity, depression, elevated LFTs, GERD, essential hypertension, hypothyroidism  who was seen in the emergency room on the 19th with area of left buttocks erythema and cellulitis at that time had an ultrasound of that area showed no evidence of abscess she was discharged home on oral clindamycin however her symptoms continue to progress worse  1.  Left buttock cellulitis -Cont IV vancomycin and Unasyn.  Added clindamycin after discussing with ID at [facility] -Patient's body habitus prevents [place] from doing any CT scan imaging but since there is no improvement will need ultrasound again to make sure there is no abscess -still spiking high grade fever -ID consult placed (no coverage today)  2.  Hypothyroidism continue Synthroid  3.  Essential hypertension continue losartan  4.  Acute kidney injury which is very mild -received IVF  5.  Morbid obesity weight loss strongly recommended  6.  Miscellaneous Lovenox for DVT prophylaxis   Case discussed with Care Management/Social Worker. Management plans discussed with the patient and they are in agreement.  CODE STATUS: full  DVT Prophylaxis: lovenonx  TOTAL TIME TAKING CARE OF THIS PATIENT: 30 minutes.  >50% time spent on counselling and coordination of care  POSSIBLE D/C IN 1-2 DAYS, DEPENDING ON CLINICAL CONDITION.  Note: This dictation was prepared with Dragon dictation along with smaller phrase technology. Any transcriptional errors that result from this process are unintentional.  Fritzi Mandes M.D on 12/20/2016 at 1:10 PM  Between 7am to 6pm - Pager - [phone]  After 6pm go to [email or web address] - password [email or web address] [facility]  Office  [phone]  CC: Primary care physician; Sharyne Peach, MD

## 2016-12-20 NOTE — Progress Notes (Signed)
Pharmacy Antibiotic Note  Dorathea Faerber is a 46 y.o. female admitted on 12/17/2016 with cellulitis.  Pharmacy has been consulted for Unasyn and Vancomyin dosing.  Patient received one dose of vancomycin 1500mg  in ED.   Plan: Will initiate antibiotics as follows. Patient has had slight Scr bump with Scr reported at 0.97 on 11/19 and current Scr of 1.14. Pharmacy will continue to monitor and adjust as needed. Vancomycin trough will be drawn prior to fourth dose.  Vancomycin 1500 IV every 8 hours.  Goal trough 10-15 mcg/mL.  Unasyn 3g Q6H  Ke= 0.096  T1/2 = 7 hours Vd= 92.2L Cmin = 12 Stacked dose @ 6 hours  ADD: Vancomycin level resulted @ 20 (~7.5 hours starting to be infused or 5.5 hours after the end of the 2 hour infusion). Will decrease Vancomycin dosing frequency from q8 hour dosing to q12 hour dosing and will recheck level prior to the 1700 dose of Vancomycin on 11/24.   11/23: Ultrasound= no abscess. Urine Cx= E.coli. F/u Vancomycin trough/Scr tomorrow.  Wt= 240 kg  BMI 85. Watch for accumulation in obese patient.  11/24: Vancomycin trough level resulted @ 16. Will continue current dosing.     Height: 5\' 6"  (167.6 cm) Weight: (!) 530 lb (240.4 kg) IBW/kg (Calculated) : 59.3  Temp (24hrs), Avg:100.3 F (37.9 C), Min:99.4 F (37.4 C), Max:102.2 F (39 C)  Recent Labs  Lab 12/15/16 0042 12/17/16 1219 12/17/16 1547 12/18/16 0507 12/18/16 1200 12/20/16 0518 12/20/16 1426  WBC 9.5 11.8*  --  9.0  --   --   --   CREATININE 0.97 1.14*  --  0.94  --  0.91  --   LATICACIDVEN 1.4 2.0* 1.3  --   --   --   --   VANCOTROUGH  --   --   --   --  20  --  16    Estimated Creatinine Clearance: 162.3 mL/min (by C-G formula based on SCr of 0.91 mg/dL).    Allergies  Allergen Reactions  . Lisinopril Cough  . Macrodantin [Nitrofurantoin Macrocrystal] Itching  . Sulfa Antibiotics Itching and Swelling    Antimicrobials this admission: Vancomycin 11/21 >>   Unasyn 11/21  >>  Dose adjustments this admission:   Microbiology results: BCx: 11/21 >> pending UCx: 11/21 >>E.coli   Chinita Greenland PharmD Clinical Pharmacist 12/20/2016

## 2016-12-20 NOTE — Plan of Care (Signed)
Continuing to monitor pt temp. Will notify Posey Pronto if over 101. Pt sat in chair for a few hours. Pt continuing to receive IV antibiotics.

## 2016-12-21 NOTE — Plan of Care (Signed)
Pt continuing to receive PO and IV antibiotics. Temp has been ok today. Pt sat in chair some throughout the day and ambulated to bathroom.

## 2016-12-21 NOTE — Progress Notes (Signed)
[facility] [place] NAME: Laura Nolan    MR#:  [id number]  DATE OF BIRTH:  1970-02-12  SUBJECTIVE:   No high-grade fever so far today. REVIEW OF SYSTEMS:   Review of Systems  Constitutional: Positive for fever. Negative for chills and weight loss.  HENT: Negative for ear discharge, ear pain and nosebleeds.   Eyes: Negative for blurred vision, pain and discharge.  Respiratory: Negative for sputum production, shortness of breath, wheezing and stridor.   Cardiovascular: Negative for chest pain, palpitations, orthopnea and PND.  Gastrointestinal: Negative for abdominal pain, diarrhea, nausea and vomiting.  Genitourinary: Negative for frequency and urgency.  Musculoskeletal: Negative for back pain and joint pain.  Skin: Positive for rash.       Left buttock  Neurological: Positive for weakness. Negative for sensory change, speech change and focal weakness.  Psychiatric/Behavioral: Negative for depression and hallucinations. The patient is not nervous/anxious.    Tolerating Diet:yes Tolerating: ambulatory  DRUG ALLERGIES:   Allergies  Allergen Reactions  . Lisinopril Cough  . Macrodantin [Nitrofurantoin Macrocrystal] Itching  . Sulfa Antibiotics Itching and Swelling    VITALS:  Blood pressure (!) 117/52, pulse 78, temperature 98 F (36.7 C), temperature source Oral, resp. rate 18, height 5\' 6"  (1.676 m), weight (!) 240.4 kg (530 lb), SpO2 97 %.  PHYSICAL EXAMINATION:   Physical Exam  GENERAL:  46 y.o.-year-old patient lying in the bed with no acute distress.  Severe morbid obesity EYES: Pupils equal, round, reactive to light and accommodation. No scleral icterus. Extraocular muscles intact.  HEENT: Head atraumatic, normocephalic. Oropharynx and nasopharynx clear.  NECK:  Supple, no jugular venous distention. No thyroid enlargement, no tenderness.  LUNGS: Normal breath sounds bilaterally, no wheezing, rales, rhonchi. No use  of accessory muscles of respiration.  CARDIOVASCULAR: S1, S2 normal. No murmurs, rubs, or gallops.  ABDOMEN: Soft, nontender, nondistended. Bowel sounds present. No organomegaly or mass.  EXTREMITIES: No cyanosis, clubbing or edema b/l.    NEUROLOGIC: Cranial nerves II through XII are intact. No focal Motor or sensory deficits b/l.   PSYCHIATRIC:  patient is alert and oriented x 3.  SKIN: No obvious rash, lesion, or ulcer.  Patient has significant cellulitis over her lower severely morbid obese left buttock which has been marked with a skin marker.  She has some skin tears/stage I ulcers over the left buttock. Improving slowly LABORATORY PANEL:  CBC Recent Labs  Lab 12/18/16 0507  WBC 9.0  HGB 9.9*  HCT 29.5*  PLT 426    Chemistries  Recent Labs  Lab 12/17/16 1219 12/18/16 0507 12/20/16 0518  NA 136 137  --   K 4.2 4.1  --   CL 99* 103  --   CO2 25 27  --   GLUCOSE 107* 102*  --   BUN 11 10  --   CREATININE 1.14* 0.94 0.91  CALCIUM 8.4* 7.9*  --   AST 54*  --   --   ALT 62*  --   --   ALKPHOS 91  --   --   BILITOT 2.1*  --   --    Cardiac Enzymes No results for input(s): TROPONINI in the last 168 hours. RADIOLOGY:  US Pelvis Limited (transabdominal Only)  Result Date: 12/19/2016 CLINICAL DATA:  Cellulitis left buttock. Fever. Evaluation for abscess . EXAM: LIMITED ULTRASOUND OF PELVIS TECHNIQUE: Limited transabdominal ultrasound examination of the pelvis was performed. COMPARISON:  Ultrasound 12/14/2016. FINDINGS: Extensive soft  tissue edema. No focal fluid collection noted to suggest abscess. No solid mass lesion. IMPRESSION: Diffuse edema noted the region of clinical concern. This consistent with a history of cellulitis. No abscess identified. Electronically Signed   By: Marcello Moores  Register   On: 12/19/2016 10:29   ASSESSMENT AND PLAN:   Laura Nolan  is a 46 y.o. female with a known history of morbid obesity, depression, elevated LFTs, GERD, essential hypertension,  hypothyroidism who was seen in the emergency room on the 19th with area of left buttocks erythema and cellulitis at that time had an ultrasound of that area showed no evidence of abscess she was discharged home on oral clindamycin however her symptoms continue to progress worse  1.Extensive  Left buttock cellulitis -Now on IV cefazolin, vanc .  Added clindamycin after discussing with ID at [facility] -Patient's body habitus prevents [place] from doing any CT scan imaging but since there is no improvement will need ultrasound again to make sure there is no abscess -still spiking high grade fever -ID consult placed (no coverage ) -BC negative Remains afebrile over 24 ours  2.  Hypothyroidism continue Synthroid  3.  Essential hypertension continue losartan  4.  Acute kidney injury which is very mild -received IVF  5.  Morbid obesity weight loss strongly recommended  6.  Miscellaneous Lovenox for DVT prophylaxis  7.ecoli UTI On cefazolin   Case discussed with Care Management/Social Worker. Management plans discussed with the patient and they are in agreement.  CODE STATUS: full  DVT Prophylaxis: lovenonx  TOTAL TIME TAKING CARE OF THIS PATIENT: 30 minutes.  >50% time spent on counselling and coordination of care  POSSIBLE D/C IN 1-2 DAYS, DEPENDING ON CLINICAL CONDITION.  Note: This dictation was prepared with Dragon dictation along with smaller phrase technology. Any transcriptional errors that result from this process are unintentional.  Fritzi Mandes M.D on 12/21/2016 at 7:26 AM  Between 7am to 6pm - Pager - [phone]  After 6pm go to [email or web address] - password [email or web address] [facility]  Office  [phone]  CC: Primary care physician; Sharyne Peach, MD

## 2016-12-22 LAB — CREATININE, SERUM
CREATININE: 0.99 mg/dL (ref 0.44–1.00)
GFR calc non Af Amer: >60 mL/min (ref >60)

## 2016-12-22 LAB — CBC
HCT: 29.1 % — ABNORMAL LOW (ref 35.0–47.0)
Hemoglobin: 9.7 g/dL — ABNORMAL LOW (ref 12.0–16.0)
MCH: 30 pg (ref 26.0–34.0)
MCHC: 33.3 g/dL (ref 32.0–36.0)
MCV: 90.1 fL (ref 80.0–100.0)
PLATELETS: 533 10*3/uL — AB (ref 150–440)
RBC: 3.23 MIL/uL — ABNORMAL LOW (ref 3.80–5.20)
RDW: 15.2 % — AB (ref 11.5–14.5)
WBC: 7.3 10*3/uL (ref 3.6–11.0)

## 2016-12-22 LAB — CULTURE, BLOOD (ROUTINE X 2)
Culture: NO GROWTH
Culture: NO GROWTH
Special Requests: ADEQUATE

## 2016-12-22 MED ORDER — CLINDAMYCIN HCL 300 MG PO CAPS: 600.0000 mg | ORAL_CAPSULE | Freq: Three times a day (TID) | ORAL | 0 refills | Status: DC

## 2016-12-22 MED ORDER — CEPHALEXIN 500 MG PO CAPS: 500.0000 mg | ORAL_CAPSULE | Freq: Three times a day (TID) | ORAL | 0 refills | Status: DC

## 2016-12-22 MED ORDER — CEPHALEXIN 500 MG PO CAPS: 500.0000 mg | ORAL_CAPSULE | Freq: Three times a day (TID) | ORAL | Status: DC

## 2016-12-22 NOTE — Progress Notes (Signed)
Pt discharged home. Discharge paperwork reviewed with pt. Pt verified where prescriptions were sent. IV removed. All questions answered to satisfaction. Pt transported to car in wheelchair pushed by staff.

## 2016-12-22 NOTE — Discharge Summary (Signed)
[facility] [place] NAME: Laura Nolan    MR#:  [id number]  DATE OF BIRTH:  1970-10-16  DATE OF ADMISSION:  12/17/2016 ADMITTING PHYSICIAN: Dustin Flock, MD  DATE OF DISCHARGE: 12/22/2016  PRIMARY CARE PHYSICIAN: Sharyne Peach, MD    ADMISSION DIAGNOSIS:  Cellulitis of buttock [L03.317]  DISCHARGE DIAGNOSIS:  Extensive cellulitis of the Left Buttock Ecoli UTI SECONDARY DIAGNOSIS:   Past Medical History:  Diagnosis Date  . Arthralgia   . Chronic renal disease, stage 3, moderately decreased glomerular filtration rate (GFR) between 30-59 mL/min/1.73 square meter (HCC)   . Depression   . Elevated LFTs   . Endometrial cancer ([place])   . Endometrial cancer ([place])   . GERD (gastroesophageal reflux disease)   . Hypertension   . Morbid obesity ([place])   . Neuropathy due to chemotherapeutic drug ([place])   . Thyroid disease   . TMJ (dislocation of temporomandibular joint)   . Vitamin B12 deficiency   . Vitamin D deficiency     HOSPITAL COURSE:  VickiDenisis a46 y.o.femalewith a known history of morbid obesity, depression, elevated LFTs, GERD, essential hypertension, hypothyroidism who was seen in the emergency room on the 19th with area of left buttocks erythema and cellulitis at that time had an ultrasound of that area showed no evidence of abscess she was discharged home on oral clindamycin however her symptoms continue to progress worse  1.ExtensiveLeft buttock cellulitis -Now on IV cefazolin, vanc ,Added clindamycin after discussing with ID at [facility]---change to po kefelx and clindamycin for 10 more days (since it is pretty extensive) -Patient's body habitus prevents [place] from doing any CT scan imaging but since there is no improvement will need ultrasound again to make sure there is no abscess -still spiking high grade fever -ID consult placed (no coverage ) -BC negative Remains afebrile over 24  ours  2.Hypothyroidism continue Synthroid  3.Essential hypertension continue losartan  4.Acute kidney injury which is very mild -received IVF  5.Morbid obesity weight loss strongly recommended  6.Miscellaneous Lovenox for DVT prophylaxis  7.ecoli UTI On cefazolin--po kelfex  Overall no high grade fever for 48 hours. D/c home. Pt agreeable   CONSULTS OBTAINED:  Treatment Team:  Leonel Ramsay, MD  DRUG ALLERGIES:   Allergies  Allergen Reactions  . Lisinopril Cough  . Macrodantin [Nitrofurantoin Macrocrystal] Itching  . Sulfa Antibiotics Itching and Swelling    DISCHARGE MEDICATIONS:   Current Discharge Medication List    START taking these medications   Details  cephALEXin (KEFLEX) 500 MG capsule Take 1 capsule (500 mg total) by mouth every 8 (eight) hours. Qty: 30 capsule, Refills: 0      CONTINUE these medications which have CHANGED   Details  clindamycin (CLEOCIN) 300 MG capsule Take 2 capsules (600 mg total) by mouth every 8 (eight) hours. Qty: 60 capsule, Refills: 0      CONTINUE these medications which have NOT CHANGED   Details  buPROPion (WELLBUTRIN XL) 150 MG 24 hr tablet Take 150 mg by mouth 2 (two) times daily.    ergocalciferol (VITAMIN D2) 50000 units capsule Take 50,000 Units by mouth once a week. Patient takes on Wednesday.    levothyroxine (SYNTHROID, LEVOTHROID) 75 MCG tablet Take 75 mcg by mouth daily before breakfast.    losartan (COZAAR) 50 MG tablet Take 50 mg by mouth daily.    ondansetron (ZOFRAN ODT) 4 MG disintegrating tablet Take 1 tablet (4 mg total) every 8 (eight) hours  as needed by mouth for nausea or vomiting. Qty: 20 tablet, Refills: 0    acetaminophen (TYLENOL) 500 MG tablet Take 1,000 mg by mouth every 6 (six) hours as needed for mild pain.    albuterol (PROVENTIL HFA;VENTOLIN HFA) 108 (90 Base) MCG/ACT inhaler Inhale 2 puffs into the lungs every 6 (six) hours as needed for wheezing or shortness of  breath.    diphenhydrAMINE (BENADRYL) 25 MG tablet Take 25 mg by mouth every 6 (six) hours as needed for allergies.      STOP taking these medications     loratadine (CLARITIN) 10 MG tablet      LORazepam (ATIVAN) 1 MG tablet      montelukast (SINGULAIR) 10 MG tablet      pantoprazole (PROTONIX) 40 MG tablet         If you experience worsening of your admission symptoms, develop shortness of breath, life threatening emergency, suicidal or homicidal thoughts you must seek medical attention immediately by calling 911 or calling your MD immediately  if symptoms less severe.  You Must read complete instructions/literature along with all the possible adverse reactions/side effects for all the Medicines you take and that have been prescribed to you. Take any new Medicines after you have completely understood and accept all the possible adverse reactions/side effects.   Please note  You were cared for by a hospitalist during your hospital stay. If you have any questions about your discharge medications or the care you received while you were in the hospital after you are discharged, you can call the unit and asked to speak with the hospitalist on call if the hospitalist that took care of you is not available. Once you are discharged, your primary care physician will handle any further medical issues. Please note that NO REFILLS for any discharge medications will be authorized once you are discharged, as it is imperative that you return to your primary care physician (or establish a relationship with a primary care physician if you do not have one) for your aftercare needs so that they can reassess your need for medications and monitor your lab values. Today   SUBJECTIVE    Doing well VITAL SIGNS:  Blood pressure 128/64, pulse 84, temperature 98.7 F (37.1 C), temperature source Oral, resp. rate 17, height 5\' 6"  (1.676 m), weight (!) 240.4 kg (530 lb), SpO2 96 %.  I/O:    Intake/Output  Summary (Last 24 hours) at 12/22/2016 1001 Last data filed at 12/22/2016 0943 Gross per 24 hour  Intake 1600 ml  Output 1550 ml  Net 50 ml    PHYSICAL EXAMINATION:  GENERAL:  46 y.o.-year-old patient lying in the bed with no acute distress.  EYES: Pupils equal, round, reactive to light and accommodation. No scleral icterus. Extraocular muscles intact.  HEENT: Head atraumatic, normocephalic. Oropharynx and nasopharynx clear.  NECK:  Supple, no jugular venous distention. No thyroid enlargement, no tenderness.  LUNGS: Normal breath sounds bilaterally, no wheezing, rales,rhonchi or crepitation. No use of accessory muscles of respiration.  CARDIOVASCULAR: S1, S2 normal. No murmurs, rubs, or gallops.  ABDOMEN: Soft, non-tender, non-distended. Bowel sounds present. No organomegaly or mass.  EXTREMITIES: No pedal edema, cyanosis, or clubbing.  NEUROLOGIC: Cranial nerves II through XII are intact. Muscle strength 5/5 in all extremities. Sensation intact. Gait not checked.  PSYCHIATRIC: The patient is alert and oriented x 3.  SKIN:Patient has significant cellulitis over her lower severely morbid obese left buttock which has been marked with a skin marker.  She has some skin tears/stage I ulcers over the left buttock. Improving slowly  DATA REVIEW:   CBC  Recent Labs  Lab 12/22/16 0435  WBC 7.3  HGB 9.7*  HCT 29.1*  PLT 533*    Chemistries  Recent Labs  Lab 12/17/16 1219 12/18/16 0507  12/22/16 0435  NA 136 137  --   --   K 4.2 4.1  --   --   CL 99* 103  --   --   CO2 25 27  --   --   GLUCOSE 107* 102*  --   --   BUN 11 10  --   --   CREATININE 1.14* 0.94   < > 0.99  CALCIUM 8.4* 7.9*  --   --   AST 54*  --   --   --   ALT 62*  --   --   --   ALKPHOS 91  --   --   --   BILITOT 2.1*  --   --   --    < > = values in this interval not displayed.    Microbiology Results   Recent Results (from the past 240 hour(s))  Urine culture     Status: Abnormal   Collection Time:  12/17/16  1:01 PM  Result Value Ref Range Status   Specimen Description URINE, RANDOM  Final   Special Requests NONE  Final   Culture >=100,000 COLONIES/mL ESCHERICHIA COLI (A)  Final   Report Status 12/20/2016 FINAL  Final   Organism ID, Bacteria ESCHERICHIA COLI (A)  Final      Susceptibility   Escherichia coli - MIC*    AMPICILLIN >=32 RESISTANT Resistant     CEFAZOLIN 16 SENSITIVE Sensitive     CEFTRIAXONE <=1 SENSITIVE Sensitive     CIPROFLOXACIN <=0.25 SENSITIVE Sensitive     GENTAMICIN <=1 SENSITIVE Sensitive     IMIPENEM <=0.25 SENSITIVE Sensitive     NITROFURANTOIN <=16 SENSITIVE Sensitive     TRIMETH/SULFA <=20 SENSITIVE Sensitive     AMPICILLIN/SULBACTAM >=32 RESISTANT Resistant     PIP/TAZO <=4 SENSITIVE Sensitive     Extended ESBL NEGATIVE Sensitive     * >=100,000 COLONIES/mL ESCHERICHIA COLI  Culture, blood (routine x 2)     Status: None   Collection Time: 12/17/16  1:27 PM  Result Value Ref Range Status   Specimen Description BLOOD LEFT ANTECUBITAL  Final   Special Requests   Final    BOTTLES DRAWN AEROBIC AND ANAEROBIC Blood Culture results may not be optimal due to an excessive volume of blood received in culture bottles   Culture NO GROWTH 5 DAYS  Final   Report Status 12/22/2016 FINAL  Final  Culture, blood (routine x 2)     Status: None   Collection Time: 12/17/16  1:45 PM  Result Value Ref Range Status   Specimen Description BLOOD RAC  Final   Special Requests   Final    BOTTLES DRAWN AEROBIC AND ANAEROBIC Blood Culture adequate volume   Culture NO GROWTH 5 DAYS  Final   Report Status 12/22/2016 FINAL  Final    RADIOLOGY:  No results found.   Management plans discussed with the patient, family and they are in agreement.  CODE STATUS:     Code Status Orders  (From admission, onward)        Start     Ordered   12/17/16 1551  Full code  Continuous     12/17/16 1550  Code Status History    Date Active Date Inactive Code Status Order ID  Comments User Context   11/11/2015 06:29 11/12/2015 13:43 Full Code [id number]  Saundra Shelling, MD Inpatient      TOTAL TIME TAKING CARE OF THIS PATIENT: *40* minutes.    Fritzi Mandes M.D on 12/22/2016 at 10:01 AM  Between 7am to 6pm - Pager - [phone] After 6pm go to [email or web address] - password [email or web address] [facility]  Office  [phone]  CC: Primary care physician; Sharyne Peach, MD

## 2017-04-08 ENCOUNTER — Encounter: Payer: 59 | Attending: Physician Assistant | Admitting: Internal Medicine

## 2017-04-08 ENCOUNTER — Other Ambulatory Visit: Payer: Self-pay | Admitting: Internal Medicine

## 2017-04-08 DIAGNOSIS — I739 Peripheral vascular disease, unspecified: Secondary | ICD-10-CM | POA: Insufficient documentation

## 2017-04-08 DIAGNOSIS — Z6841 Body Mass Index (BMI) 40.0 and over, adult: Secondary | ICD-10-CM | POA: Diagnosis not present

## 2017-04-08 DIAGNOSIS — Z882 Allergy status to sulfonamides status: Secondary | ICD-10-CM | POA: Insufficient documentation

## 2017-04-08 DIAGNOSIS — Z9221 Personal history of antineoplastic chemotherapy: Secondary | ICD-10-CM | POA: Diagnosis not present

## 2017-04-08 DIAGNOSIS — L8992 Pressure ulcer of unspecified site, stage 2: Secondary | ICD-10-CM | POA: Insufficient documentation

## 2017-04-08 DIAGNOSIS — I10 Essential (primary) hypertension: Secondary | ICD-10-CM | POA: Diagnosis not present

## 2017-04-08 DIAGNOSIS — J45909 Unspecified asthma, uncomplicated: Secondary | ICD-10-CM | POA: Insufficient documentation

## 2017-04-08 DIAGNOSIS — I890 Lymphedema, not elsewhere classified: Secondary | ICD-10-CM | POA: Insufficient documentation

## 2017-04-08 DIAGNOSIS — I252 Old myocardial infarction: Secondary | ICD-10-CM | POA: Insufficient documentation

## 2017-04-08 DIAGNOSIS — Z888 Allergy status to other drugs, medicaments and biological substances status: Secondary | ICD-10-CM | POA: Insufficient documentation

## 2017-04-08 DIAGNOSIS — L0231 Cutaneous abscess of buttock: Secondary | ICD-10-CM

## 2017-04-09 NOTE — Progress Notes (Signed)
Laura Nolan, Laura Nolan ([id number]) Visit Report for 04/08/2017 Abuse/Suicide Risk Screen Details Patient Name: Laura Nolan, Laura Nolan Date of Service: 04/08/2017 8:00 AM Medical Record Number: [id number] Patient Account Number: [id number] Date of Birth/Sex: 10-26-1970 (47 y.o. Female) Treating RN: Carolyne Fiscal, Debi Primary Care Ruchi Stoney: Salome Holmes Other Clinician: Referring Meigan Pates: Salome Holmes Treating Chevie Birkhead/Extender: Tito Dine in Treatment: 0 Abuse/Suicide Risk Screen Items Answer ABUSE/SUICIDE RISK SCREEN: Has anyone close to you tried to hurt or harm you recentlyo No Do you feel uncomfortable with anyone in your familyo No Has anyone forced you do things that you didnot want to doo No Do you have any thoughts of harming yourselfo No Patient displays signs or symptoms of abuse and/or neglect. No Electronic Signature(s) Signed: 04/08/2017 8:38:15 AM By: Alric Quan Entered By: Alric Quan on 04/08/2017 08:18:42 Laura Nolan ([id number]) -------------------------------------------------------------------------------- Activities of Daily Living Details Patient Name: Laura Nolan Date of Service: 04/08/2017 8:00 AM Medical Record Number: [id number] Patient Account Number: [id number] Date of Birth/Sex: 05-17-1970 (47 y.o. Female) Treating RN: Carolyne Fiscal, Debi Primary Care Jaiyah Beining: Salome Holmes Other Clinician: Referring Macrina Lehnert: Salome Holmes Treating Symphony Demuro/Extender: Tito Dine in Treatment: 0 Activities of Daily Living Items Answer Activities of Daily Living (Please select one for each item) Drive Automobile Completely Able Take Medications Completely Able Use Telephone Completely Able Care for Appearance Completely Able Use Toilet Completely Able Bath / Shower Completely Able Dress Self Completely Able Feed Self Completely Able Walk Completely Able Get In / Out Bed Completely Able Housework Completely Able Prepare Meals Completely Betances for Self Completely Able Electronic Signature(s) Signed: 04/08/2017 8:38:15 AM By: Alric Quan Entered By: Alric Quan on 04/08/2017 08:19:08 Laura Nolan ([id number]) -------------------------------------------------------------------------------- Education Assessment Details Patient Name: Laura Nolan Date of Service: 04/08/2017 8:00 AM Medical Record Number: [id number] Patient Account Number: [id number] Date of Birth/Sex: [birth year] (47 y.o. Female) Treating RN: Carolyne Fiscal, Debi Primary Care Allin Frix: Salome Holmes Other Clinician: Referring Annastyn Silvey: Salome Holmes Treating Nainika Newlun/Extender: Tito Dine in Treatment: 0 Primary Learner Assessed: Patient Learning Preferences/Education Level/Primary Language Learning Preference: Explanation Highest Education Level: High School Preferred Language: English Cognitive Barrier Assessment/Beliefs Language Barrier: No Translator Needed: No Memory Deficit: No Emotional Barrier: No Cultural/Religious Beliefs Affecting Medical Care: No Physical Barrier Assessment Impaired Vision: Yes Glasses Impaired Hearing: No Decreased Hand dexterity: No Knowledge/Comprehension Assessment Knowledge Level: High Comprehension Level: High Ability to understand written High instructions: Ability to understand verbal High instructions: Motivation Assessment Anxiety Level: Calm Cooperation: Cooperative Education Importance: Acknowledges Need Interest in Health Problems: Asks Questions Perception: Coherent Willingness to Engage in Self- High Management Activities: Readiness to Engage in Self- High Management Activities: Electronic Signature(s) Signed: 04/08/2017 8:38:15 AM By: Alric Quan Entered By: Alric Quan on 04/08/2017 08:19:53 Laura Nolan ([id number]) -------------------------------------------------------------------------------- Fall Risk Assessment Details Patient Name:  Laura Nolan Date of Service: 04/08/2017 8:00 AM Medical Record Number: [id number] Patient Account Number: [id number] Date of Birth/Sex: Nov 07, 1970 (47 y.o. Female) Treating RN: Carolyne Fiscal, Debi Primary Care Valeree Leidy: Salome Holmes Other Clinician: Referring Nickol Collister: Salome Holmes Treating Earlie Arciga/Extender: Tito Dine in Treatment: 0 Fall Risk Assessment Items Have you had 2 or more falls in the last 12 monthso 0 No Have you had any fall that resulted in injury in the last 12 monthso 0 No FALL RISK ASSESSMENT: History of falling - immediate or within 3 months 0 No Secondary diagnosis 0 No Ambulatory aid None/bed rest/wheelchair/nurse 0 No Crutches/cane/walker 0 No Furniture 0 No IV Access/Saline Lock 0 No Gait/Training Normal/bed  rest/immobile 0 No Weak 0 No Impaired 0 No Mental Status Oriented to own ability 0 No Electronic Signature(s) Signed: 04/08/2017 8:38:15 AM By: Alric Quan Entered By: Alric Quan on 04/08/2017 08:20:27 Laura Nolan ([id number]) -------------------------------------------------------------------------------- Foot Assessment Details Patient Name: Laura Nolan Date of Service: 04/08/2017 8:00 AM Medical Record Number: [id number] Patient Account Number: [id number] Date of Birth/Sex: 1970/05/07 (48 y.o. Female) Treating RN: Carolyne Fiscal, Debi Primary Care Sayvion Vigen: Salome Holmes Other Clinician: Referring Braxden Lovering: Salome Holmes Treating Janeece Blok/Extender: Tito Dine in Treatment: 0 Foot Assessment Items Site Locations + = Sensation present, - = Sensation absent, C = Callus, U = Ulcer R = Redness, W = Warmth, M = Maceration, PU = Pre-ulcerative lesion F = Fissure, S = Swelling, D = Dryness Assessment Right: Left: Other Deformity: No No Prior Foot Ulcer: No No Prior Amputation: No No Charcot Joint: No No Ambulatory Status: Gait: Electronic Signature(s) Signed: 04/08/2017 8:38:15 AM By: Alric Quan Entered  By: Alric Quan on 04/08/2017 08:20:56 Laura Nolan ([id number]) -------------------------------------------------------------------------------- Nutrition Risk Assessment Details Patient Name: Laura Nolan Date of Service: 04/08/2017 8:00 AM Medical Record Number: [id number] Patient Account Number: [id number] Date of Birth/Sex: 1970/03/26 (47 y.o. Female) Treating RN: Carolyne Fiscal, Debi Primary Care Alyana Kreiter: Salome Holmes Other Clinician: Referring Rexanne Inocencio: Salome Holmes Treating Karis Emig/Extender: Ricard Dillon Weeks in Treatment: 0 Height (in): Weight (lbs): Body Mass Index (BMI): Nutrition Risk Assessment Items NUTRITION RISK SCREEN: I have an illness or condition that made me change the kind and/or amount of 0 No food I eat I eat fewer than two meals per day 0 No I eat few fruits and vegetables, or milk products 0 No I have three or more drinks of beer, liquor or wine almost every day 0 No I have tooth or mouth problems that make it hard for me to eat 0 No I don't always have enough money to buy the food I need 0 No I eat alone most of the time 0 No I take three or more different prescribed or over-the-counter drugs a day 0 No Without wanting to, I have lost or gained 10 pounds in the last six months 0 No I am not always physically able to shop, cook and/or feed myself 0 No Nutrition Protocols Good Risk Protocol 0 No interventions needed Moderate Risk Protocol Electronic Signature(s) Signed: 04/08/2017 8:38:15 AM By: Alric Quan Entered By: Alric Quan on 04/08/2017 08:20:43

## 2017-04-09 NOTE — Progress Notes (Addendum)
JANARI, GAGNER ([id number]) Visit Report for 04/08/2017 Allergy List Details Patient Name: Laura Nolan, Laura Nolan Date of Service: 04/08/2017 8:00 AM Medical Record Number: [id number] Patient Account Number: [id number] Date of Birth/Sex: Oct 05, 1970 (47 y.o. Female) Treating RN: Carolyne Fiscal, Debi Primary Care Conlan Miceli: Salome Holmes Other Clinician: Referring Amana Bouska: Salome Holmes Treating Anjela Cassara/Extender: Ricard Dillon Weeks in Treatment: 0 Allergies Active Allergies sulfa lisinopril Macrodantin Allergy Notes Electronic Signature(s) Signed: 04/08/2017 8:38:15 AM By: Alric Quan Entered By: Alric Quan on 04/08/2017 08:06:11 Laura Nolan ([id number]) -------------------------------------------------------------------------------- Arrival Information Details Patient Name: Laura Nolan Date of Service: 04/08/2017 8:00 AM Medical Record Number: [id number] Patient Account Number: [id number] Date of Birth/Sex: [birth year] (47 y.o. Female) Treating RN: Carolyne Fiscal, Debi Primary Care Arwin Bisceglia: Salome Holmes Other Clinician: Referring Kasandra Fehr: Salome Holmes Treating Gawain Crombie/Extender: Tito Dine in Treatment: 0 Visit Information Patient Arrived: Cane Arrival Time: 08:03 Accompanied By: self Transfer Assistance: None Patient Identification Verified: Yes Secondary Verification Process Completed: Yes Electronic Signature(s) Signed: 04/08/2017 8:38:15 AM By: Alric Quan Entered By: Alric Quan on 04/08/2017 08:05:37 Laura Nolan ([id number]) -------------------------------------------------------------------------------- Clinic Level of Care Assessment Details Patient Name: Laura Nolan Date of Service: 04/08/2017 8:00 AM Medical Record Number: [id number] Patient Account Number: [id number] Date of Birth/Sex: 13-Apr-1970 (47 y.o. Female) Treating RN: Cornell Barman Primary Care Kymiah Araiza: Salome Holmes Other Clinician: Referring Raiquan Chandler: Salome Holmes Treating  Raymondo Garcialopez/Extender: Tito Dine in Treatment: 0 Clinic Level of Care Assessment Items TOOL 2 Quantity Score []  - Use when only an EandM is performed on the INITIAL visit 0 ASSESSMENTS - Nursing Assessment / Reassessment X - General Physical Exam (combine w/ comprehensive assessment (listed just below) when 1 20 performed on new pt. evals) X- 1 25 Comprehensive Assessment (HX, ROS, Risk Assessments, Wounds Hx, etc.) ASSESSMENTS - Wound and Skin Assessment / Reassessment []  - Simple Wound Assessment / Reassessment - one wound 0 X- 2 5 Complex Wound Assessment / Reassessment - multiple wounds []  - 0 Dermatologic / Skin Assessment (not related to wound area) ASSESSMENTS - Ostomy and/or Continence Assessment and Care []  - Incontinence Assessment and Management 0 []  - 0 Ostomy Care Assessment and Management (repouching, etc.) PROCESS - Coordination of Care X - Simple Patient / Family Education for ongoing care 1 15 []  - 0 Complex (extensive) Patient / Family Education for ongoing care X- 1 10 Staff obtains Programmer, systems, Records, Test Results / Process Orders []  - 0 Staff telephones HHA, Nursing Homes / Clarify orders / etc []  - 0 Routine Transfer to another Facility (non-emergent condition) []  - 0 Routine Hospital Admission (non-emergent condition) []  - 0 New Admissions / Biomedical engineer / Ordering NPWT, Apligraf, etc. []  - 0 Emergency Hospital Admission (emergent condition) X- 1 10 Simple Discharge Coordination []  - 0 Complex (extensive) Discharge Coordination PROCESS - Special Needs []  - Pediatric / Minor Patient Management 0 []  - 0 Isolation Patient Management GIAVANA, ROOKE ([id number]) []  - 0 Hearing / Language / Visual special needs []  - 0 Assessment of Community assistance (transportation, D/C planning, etc.) []  - 0 Additional assistance / Altered mentation []  - 0 Support Surface(s) Assessment (bed, cushion, seat, etc.) INTERVENTIONS - Wound  Cleansing / Measurement X - Wound Imaging (photographs - any number of wounds) 1 5 []  - 0 Wound Tracing (instead of photographs) []  - 0 Simple Wound Measurement - one wound X- 2 5 Complex Wound Measurement - multiple wounds []  - 0 Simple Wound Cleansing - one wound X- 2 5 Complex Wound Cleansing - multiple wounds INTERVENTIONS - Wound Dressings []  -  Small Wound Dressing one or multiple wounds 0 X- 2 15 Medium Wound Dressing one or multiple wounds []  - 0 Large Wound Dressing one or multiple wounds []  - 0 Application of Medications - injection INTERVENTIONS - Miscellaneous []  - External ear exam 0 []  - 0 Specimen Collection (cultures, biopsies, blood, body fluids, etc.) []  - 0 Specimen(s) / Culture(s) sent or taken to Lab for analysis []  - 0 Patient Transfer (multiple staff / Civil Service fast streamer / Similar devices) []  - 0 Simple Staple / Suture removal (25 or less) []  - 0 Complex Staple / Suture removal (26 or more) []  - 0 Hypo / Hyperglycemic Management (close monitor of Blood Glucose) []  - 0 Ankle / Brachial Index (ABI) - do not check if billed separately Has the patient been seen at the hospital within the last three years: Yes Total Score: 145 Level Of Care: New/Established - Level 4 Electronic Signature(s) Signed: 04/08/2017 5:38:59 PM By: Gretta Cool, BSN, RN, CWS, Kim RN, BSN Entered By: Gretta Cool, BSN, RN, CWS, Kim on 04/08/2017 08:55:05 Laura Nolan ([id number]) -------------------------------------------------------------------------------- Encounter Discharge Information Details Patient Name: Laura Nolan Date of Service: 04/08/2017 8:00 AM Medical Record Number: [id number] Patient Account Number: [id number] Date of Birth/Sex: [birth year] (47 y.o. Female) Treating RN: Montey Hora Primary Care Amareon Phung: Salome Holmes Other Clinician: Referring Ky Rumple: Salome Holmes Treating Eulalie Speights/Extender: Tito Dine in Treatment: 0 Encounter Discharge Information  Items Discharge Pain Level: 0 Discharge Condition: Stable Ambulatory Status: Cane Discharge Destination: Home Transportation: Private Auto Accompanied By: self Schedule Follow-up Appointment: Yes Medication Reconciliation completed and No provided to Patient/Care Caleel Kiner: Provided on Clinical Summary of Care: 04/08/2017 Form Type Recipient Paper Patient VD Electronic Signature(s) Signed: 04/13/2017 2:38:37 PM By: Ruthine Dose Entered By: Ruthine Dose on 04/08/2017 09:05:59 Laura Nolan ([id number]) -------------------------------------------------------------------------------- Lower Extremity Assessment Details Patient Name: Laura Nolan Date of Service: 04/08/2017 8:00 AM Medical Record Number: [id number] Patient Account Number: [id number] Date of Birth/Sex: 1970/05/08 (47 y.o. Female) Treating RN: Carolyne Fiscal, Debi Primary Care Amarah Brossman: Salome Holmes Other Clinician: Referring Sharine Cadle: Salome Holmes Treating Achsah Mcquade/Extender: Ricard Dillon Weeks in Treatment: 0 Electronic Signature(s) Signed: 04/08/2017 8:38:15 AM By: Alric Quan Entered By: Alric Quan on 04/08/2017 08:27:45 Laura Nolan ([id number]) -------------------------------------------------------------------------------- Multi Wound Chart Details Patient Name: Laura Nolan Date of Service: 04/08/2017 8:00 AM Medical Record Number: [id number] Patient Account Number: [id number] Date of Birth/Sex: [birth year] (47 y.o. Female) Treating RN: Cornell Barman Primary Care Journei Thomassen: Salome Holmes Other Clinician: Referring Sahiti Joswick: Salome Holmes Treating Kimara Bencomo/Extender: Tito Dine in Treatment: 0 Vital Signs Height(in): 66 Pulse(bpm): 106 Weight(lbs): 500 Blood Pressure(mmHg): 147/90 Body Mass Index(BMI): 81 Temperature(F): 99.6 Respiratory Rate 20 (breaths/min): Photos: [1:No Photos] [2:No Photos] [N/A:N/A] Wound Location: [1:Left Gluteus] [2:Left Gluteal fold] [N/A:N/A] Wounding  Event: [1:Gradually Appeared] [2:Gradually Appeared] [N/A:N/A] Primary Etiology: [1:Lymphedema] [2:Pressure Ulcer] [N/A:N/A] Comorbid History: [1:Lymphedema, Asthma, Hypertension, Myocardial Infarction, Received Chemotherapy] [2:Lymphedema, Asthma, Hypertension, Myocardial Infarction, Received Chemotherapy] [N/A:N/A] Date Acquired: [1:12/18/2016] [2:12/18/2016] [N/A:N/A] Weeks of Treatment: [1:0] [2:0] [N/A:N/A] Wound Status: [1:Open] [2:Open] [N/A:N/A] Clustered Wound: [1:No] [2:Yes] [N/A:N/A] Clustered Quantity: [1:N/A] [2:3] [N/A:N/A] Measurements L x W x D [1:0.1x0.1x0.1] [2:3x1.5x0.1] [N/A:N/A] (cm) Area (cm) : [1:0.008] [2:3.534] [N/A:N/A] Volume (cm) : [1:0.001] [2:0.353] [N/A:N/A] Classification: [1:Partial Thickness] [2:Category/Stage III] [N/A:N/A] Exudate Amount: [1:Large] [2:Medium] [N/A:N/A] Exudate Type: [1:Serous] [2:Serosanguineous] [N/A:N/A] Exudate Color: [1:amber] [2:red, brown] [N/A:N/A] Wound Margin: [1:Flat and Intact] [2:Indistinct, nonvisible] [N/A:N/A] Granulation Amount: [1:Large (67-100%)] [2:Medium (34-66%)] [N/A:N/A] Granulation Quality: [1:Red] [2:Red] [N/A:N/A] Necrotic Amount: [1:None Present (0%)] [2:Medium (34-66%)] [N/A:N/A] Exposed Structures: [1:Fascia: No  Fat Layer (Subcutaneous Tissue) Exposed: No Tendon: No Muscle: No Joint: No Bone: No] [2:Fascia: No Fat Layer (Subcutaneous Tissue) Exposed: No Tendon: No Muscle: No Joint: No Bone: No] [N/A:N/A] Epithelialization: [1:None] [2:None] [N/A:N/A] Periwound Skin Texture: [1:Excoriation: No Induration: No Callus: No Crepitus: No] [2:Excoriation: No Induration: No Callus: No Crepitus: No] [N/A:N/A] Rash: No Rash: No Scarring: No Scarring: No Periwound Skin Moisture: Maceration: No Maceration: No N/A Dry/Scaly: No Dry/Scaly: No Periwound Skin Color: Atrophie Blanche: No Erythema: Yes N/A Cyanosis: No Atrophie Blanche: No Ecchymosis: No Cyanosis: No Erythema: No Ecchymosis: No Hemosiderin  Staining: No Hemosiderin Staining: No Mottled: No Mottled: No Pallor: No Pallor: No Rubor: No Rubor: No Erythema Location: N/A Circumferential N/A Temperature: N/A No Abnormality N/A Tenderness on Palpation: No Yes N/A Wound Preparation: Ulcer Cleansing: Ulcer Cleansing: N/A Rinsed/Irrigated with Saline Rinsed/Irrigated with Saline Topical Anesthetic Applied: Topical Anesthetic Applied: Other: lidocaine 4% Other: lidocaine 4% Treatment Notes Electronic Signature(s) Signed: 04/08/2017 5:42:24 PM By: Linton Ham MD Entered By: Linton Ham on 04/08/2017 08:58:50 Laura Nolan ([id number]) -------------------------------------------------------------------------------- Multi-Disciplinary Care Plan Details Patient Name: Laura Nolan Date of Service: 04/08/2017 8:00 AM Medical Record Number: [id number] Patient Account Number: [id number] Date of Birth/Sex: 1970-06-21 (47 y.o. Female) Treating RN: Cornell Barman Primary Care Kayelynn Abdou: Salome Holmes Other Clinician: Referring Pamila Mendibles: Salome Holmes Treating Min Collymore/Extender: Tito Dine in Treatment: 0 Active Inactive ` Abuse / Safety / Falls / Self Care Management Nursing Diagnoses: Impaired physical mobility Goals: Patient will remain injury free related to falls Date Initiated: 04/08/2017 Target Resolution Date: 05/09/2017 Goal Status: Active Patient/caregiver will verbalize/demonstrate measures taken to prevent injury and/or falls Date Initiated: 04/08/2017 Target Resolution Date: 05/09/2017 Goal Status: Active Interventions: Podiatry chair, stretcher in low position and side rails up as needed Notes: ` Orientation to the Wound Care Program Nursing Diagnoses: Knowledge deficit related to the wound healing center program Goals: Patient/caregiver will verbalize understanding of the [facility] Date Initiated: 04/08/2017 Target Resolution Date: 05/09/2017 Goal Status:  Active Interventions: Provide education on orientation to the wound center Notes: ` Pressure Nursing Diagnoses: Knowledge deficit related to management of pressures ulcers Goals: Patient will remain free from development of additional pressure ulcers Date Initiated: 04/08/2017 Target Resolution Date: 05/15/2017 SHAWNETTE, AUGELLO ([id number]) Goal Status: Active Interventions: Assess: immobility, friction, shearing, incontinence upon admission and as needed Notes: ` Wound/Skin Impairment Nursing Diagnoses: Impaired tissue integrity Knowledge deficit related to smoking impact on wound healing Goals: Ulcer/skin breakdown will heal within 14 weeks Date Initiated: 04/08/2017 Target Resolution Date: 07/26/2017 Goal Status: Active Interventions: Assess ulceration(s) every visit Treatment Activities: Skin care regimen initiated : 04/08/2017 Notes: Electronic Signature(s) Signed: 04/08/2017 5:38:59 PM By: Gretta Cool, BSN, RN, CWS, Kim RN, BSN Entered By: Gretta Cool, BSN, RN, CWS, Kim on 04/08/2017 08:39:09 Laura Nolan ([id number]) -------------------------------------------------------------------------------- Pain Assessment Details Patient Name: Laura Nolan Date of Service: 04/08/2017 8:00 AM Medical Record Number: [id number] Patient Account Number: [id number] Date of Birth/Sex: 31-May-1970 (47 y.o. Female) Treating RN: Carolyne Fiscal, Debi Primary Care Maranda Marte: Salome Holmes Other Clinician: Referring Reesha Debes: Salome Holmes Treating Tashima Scarpulla/Extender: Tito Dine in Treatment: 0 Active Problems Location of Pain Severity and Description of Pain Patient Has Paino Yes Site Locations Duration of the Pain. Constant / Intermittento Intermittent Rate the pain. Current Pain Level: 2 Character of Pain Describe the Pain: Sharp Pain Management and Medication Current Pain Management: Electronic Signature(s) Signed: 04/08/2017 8:38:15 AM By: Alric Quan Entered By: Alric Quan on  04/08/2017 08:21:38 Laura Nolan ([id number]) -------------------------------------------------------------------------------- Patient/Caregiver Education Details Patient  Name: ZAHRAH, SUTHERLIN Date of Service: 04/08/2017 8:00 AM Medical Record Number: [id number] Patient Account Number: [id number] Date of Birth/Gender: 06-May-1970 (47 y.o. Female) Treating RN: Montey Hora Primary Care Physician: Salome Holmes Other Clinician: Referring Physician: Salome Holmes Treating Physician/Extender: Tito Dine in Treatment: 0 Education Assessment Education Provided To: Patient Education Topics Provided Wound/Skin Impairment: Handouts: Other: wound care as ordered Methods: Demonstration, Explain/Verbal Responses: State content correctly Electronic Signature(s) Signed: 04/08/2017 5:19:26 PM By: Montey Hora Entered By: Montey Hora on 04/08/2017 09:02:24 Laura Nolan ([id number]) -------------------------------------------------------------------------------- Wound Assessment Details Patient Name: Laura Nolan Date of Service: 04/08/2017 8:00 AM Medical Record Number: [id number] Patient Account Number: [id number] Date of Birth/Sex: 1970/03/04 (47 y.o. Female) Treating RN: Carolyne Fiscal, Debi Primary Care Wanna Gully: Salome Holmes Other Clinician: Referring Hadar Elgersma: Salome Holmes Treating Shelba Susi/Extender: Tito Dine in Treatment: 0 Wound Status Wound Number: 1 Primary Lymphedema Etiology: Wound Location: Left Gluteus Wound Open Wounding Event: Gradually Appeared Status: Date Acquired: 12/18/2016 Comorbid Lymphedema, Asthma, Hypertension, Weeks Of Treatment: 0 History: Myocardial Infarction, Received Chemotherapy Clustered Wound: No Photos Photo Uploaded By: Alric Quan on 04/08/2017 17:10:06 Wound Measurements Length: (cm) 0.1 Width: (cm) 0.1 Depth: (cm) 0.1 Area: (cm) 0.008 Volume: (cm) 0.001 % Reduction in Area: % Reduction in  Volume: Epithelialization: None Tunneling: No Undermining: No Wound Description Classification: Partial Thickness Wound Margin: Flat and Intact Exudate Amount: Large Exudate Type: Serous Exudate Color: amber Foul Odor After Cleansing: No Slough/Fibrino No Wound Bed Granulation Amount: Large (67-100%) Exposed Structure Granulation Quality: Red Fascia Exposed: No Necrotic Amount: None Present (0%) Fat Layer (Subcutaneous Tissue) Exposed: No Tendon Exposed: No Muscle Exposed: No Joint Exposed: No Bone Exposed: No Periwound Skin Texture Waage, Denina ([id number]) Texture Color No Abnormalities Noted: No No Abnormalities Noted: No Callus: No Atrophie Blanche: No Crepitus: No Cyanosis: No Excoriation: No Ecchymosis: No Induration: No Erythema: No Rash: No Hemosiderin Staining: No Scarring: No Mottled: No Pallor: No Moisture Rubor: No No Abnormalities Noted: No Dry / Scaly: No Maceration: No Wound Preparation Ulcer Cleansing: Rinsed/Irrigated with Saline Topical Anesthetic Applied: Other: lidocaine 4%, Treatment Notes Wound #1 (Left Gluteus) 1. Cleansed with: Clean wound with Normal Saline 4. Dressing Applied: Aquacel Ag Other dressing (specify in notes) 7. Secured with Tape Notes Manufacturing systems engineer) Signed: 04/08/2017 8:38:15 AM By: Alric Quan Entered By: Alric Quan on 04/08/2017 08:24:24 Laura Nolan ([id number]) -------------------------------------------------------------------------------- Wound Assessment Details Patient Name: Laura Nolan Date of Service: 04/08/2017 8:00 AM Medical Record Number: [id number] Patient Account Number: [id number] Date of Birth/Sex: [birth year] (47 y.o. Female) Treating RN: Carolyne Fiscal, Debi Primary Care Steffani Dionisio: Salome Holmes Other Clinician: Referring Jorje Vanatta: Salome Holmes Treating Devynn Scheff/Extender: Tito Dine in Treatment: 0 Wound Status Wound Number: 2 Primary Pressure  Ulcer Etiology: Wound Location: Left Gluteal fold Wound Open Wounding Event: Gradually Appeared Status: Date Acquired: 12/18/2016 Comorbid Lymphedema, Asthma, Hypertension, Weeks Of Treatment: 0 History: Myocardial Infarction, Received Chemotherapy Clustered Wound: Yes Photos Photo Uploaded By: Alric Quan on 04/08/2017 17:11:38 Wound Measurements Length: (cm) 3 Width: (cm) 1.5 Depth: (cm) 0.1 Clustered Quantity: 3 Area: (cm) 3.534 Volume: (cm) 0.353 % Reduction in Area: % Reduction in Volume: Epithelialization: None Tunneling: No Undermining: No Wound Description Classification: Category/Stage III Wound Margin: Indistinct, nonvisible Exudate Amount: Medium Exudate Type: Serosanguineous Exudate Color: red, brown Foul Odor After Cleansing: No Slough/Fibrino Yes Wound Bed Granulation Amount: Medium (34-66%) Exposed Structure Granulation Quality: Red Fascia Exposed: No Necrotic Amount: Medium (34-66%) Fat Layer (Subcutaneous Tissue) Exposed: No Necrotic Quality: Adherent Slough Tendon Exposed: No Muscle  Exposed: No Joint Exposed: No Bone Exposed: No Periwound Skin Texture Faeth, Adel ([id number]) Texture Color No Abnormalities Noted: No No Abnormalities Noted: No Callus: No Atrophie Blanche: No Crepitus: No Cyanosis: No Excoriation: No Ecchymosis: No Induration: No Erythema: Yes Rash: No Erythema Location: Circumferential Scarring: No Hemosiderin Staining: No Mottled: No Moisture Pallor: No No Abnormalities Noted: No Rubor: No Dry / Scaly: No Maceration: No Temperature / Pain Temperature: No Abnormality Tenderness on Palpation: Yes Wound Preparation Ulcer Cleansing: Rinsed/Irrigated with Saline Topical Anesthetic Applied: Other: lidocaine 4%, Treatment Notes Wound #2 (Left Gluteal fold) 1. Cleansed with: Clean wound with Normal Saline 2. Anesthetic Topical Lidocaine 4% cream to wound bed prior to debridement 4. Dressing  Applied: Other dressing (specify in notes) 5. Secondary Dressing Applied Bordered Foam Dressing Notes [employer]cel Electronic Signature(s) Signed: 04/08/2017 8:38:15 AM By: Alric Quan Entered By: Alric Quan on 04/08/2017 08:27:03 Laura Nolan ([id number]) -------------------------------------------------------------------------------- Vitals Details Patient Name: Laura Nolan Date of Service: 04/08/2017 8:00 AM Medical Record Number: [id number] Patient Account Number: [id number] Date of Birth/Sex: 1970-02-23 (47 y.o. Female) Treating RN: Carolyne Fiscal, Debi Primary Care Abshir Paolini: Salome Holmes Other Clinician: Referring Mushka Laconte: Salome Holmes Treating Glenice Ciccone/Extender: Tito Dine in Treatment: 0 Vital Signs Time Taken: 08:22 Temperature (F): 99.6 Height (in): 66 Pulse (bpm): 106 Source: Stated Respiratory Rate (breaths/min): 20 Weight (lbs): 500 Blood Pressure (mmHg): 147/90 Source: Stated Reference Range: 80 - 120 mg / dl Body Mass Index (BMI): 80.7 Electronic Signature(s) Signed: 04/08/2017 8:38:15 AM By: Alric Quan Entered By: Alric Quan on 04/08/2017 08:22:31

## 2017-04-10 NOTE — Progress Notes (Signed)
KRESTA, TEMPLEMAN ([id number]) Visit Report for 04/08/2017 Chief Complaint Document Details Patient Name: Laura Nolan, Laura Nolan Date of Service: 04/08/2017 8:00 AM Medical Record Number: [id number] Patient Account Number: [id number] Date of Birth/Sex: April 03, 1970 (47 y.o. Female) Treating RN: Cornell Barman Primary Care Provider: Salome Holmes Other Clinician: Referring Provider: Salome Holmes Treating Provider/Extender: Tito Dine in Treatment: 0 Information Obtained from: Patient Chief Complaint 04/08/17; patient is here for review of 3 wounds in her posterior left gluteal fold and a draining area in the upper left buttock area Electronic Signature(s) Signed: 04/08/2017 5:42:24 PM By: Linton Ham MD Entered By: Linton Ham on 04/08/2017 08:59:23 Laura Nolan ([id number]) -------------------------------------------------------------------------------- HPI Details Patient Name: Laura Nolan Date of Service: 04/08/2017 8:00 AM Medical Record Number: [id number] Patient Account Number: [id number] Date of Birth/Sex: [birth year] (47 y.o. Female) Treating RN: Cornell Barman Primary Care Provider: Salome Holmes Other Clinician: Referring Provider: Salome Holmes Treating Provider/Extender: Tito Dine in Treatment: 0 History of Present Illness HPI Description: 04/08/17; ADMISSION This is a 47 year old woman with morbid obesity and significant massive lymphedema. Particular area of interest is in the left b. extending into the hip where she has some massive outgrowth of soft tissue pannus with classic skin changes of lymphedema. She was hospitalized from 12/17/17 through 12/22/17 an extensive cellulitis. Initially treated with IV cefazolin and vancomycin and discharged on Keflex and clindamycin. An ultrasound of the area was done because I don't believe they could do a CT scan that did not show an abscess. 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 She also has a small draining area superiorly which constantly drains and soaks her clothing. She states she has some discomfort but no real pain similar to what she had when she had cellulitis in the hospital. She has been using topical antibiotics to the open areas. She noted that she had a temperature yesterday with chills her temperature is 99.6 today in clinic. She denies cough, dysuria, diarrhea The patient is not a diabetic. She has depression, elevated liver function tests, hypertension, hypothyroidism stage III chronic renal failure. She had a hemoglobin of 9.7 when she is in the hospital I don't see an exact reason for that. Electronic Signature(s) Signed: 04/08/2017 5:42:24 PM By: Linton Ham MD Entered By: Linton Ham on 04/08/2017 09:11:35 Laura Nolan ([id number]) -------------------------------------------------------------------------------- Physical Exam Details Patient Name: Laura Nolan Date of Service: 04/08/2017 8:00 AM Medical Record Number: [id number] Patient Account Number: [id number] Date of Birth/Sex: [birth year] (47 y.o. Female) Treating RN: Cornell Barman Primary Care Provider: Salome Holmes Other Clinician: Referring Provider: Salome Holmes Treating Provider/Extender: Tito Dine in Treatment: 0 Constitutional Patient is hypertensive.. Pulse regular and within target range for patient.Marland Kitchen Respirations regular, non-labored and within target range.. Patient is febrile today.mild fever at 99.6. morbid obesity, not in overt medical distress. Eyes Conjunctivae clear. No discharge. Respiratory Respiratory effort is easy and symmetric bilaterally. Rate is normal at rest and on room air.. Bilateral breath sounds are clear and equal in all lobes with no wheezes, rales or  rhonchi.. Cardiovascular Heart rhythm and rate regular, without murmur or gallop.. Gastrointestinal (GI) morbid obesity with a large lower abdominal pannus. No tenderness. She has a ventral abdominal hernia.. Genitourinary (GU) no overt tenderness or CVA tenderness. Integumentary (Hair, Skin) widespread lymphedema  including her lower extremities massively over the left buttock and hip area. Psychiatric history of depression no major issues. Notes wound exam; under the lower fold of this patient's buttock on the left. There are 3 closely related ulcers. Base of these all looks clean. No need for debridement. These were not overtly infected. Surrounding this area massive lymphedema. There is some pinkish hue to the skin here but no tenderness this is not overtly infected. The wounds are not overtly infected. odissecting concerning area is on the left buttock medially. A small area leave gain what appears to be edema fluid on a continuous drip basis. There is no depth and I could not probe this note. No surrounding tenderness. This looks like leaking edema fluid from lymphedema Electronic Signature(s) Signed: 04/08/2017 5:42:24 PM By: Linton Ham MD Entered By: Linton Ham on 04/08/2017 09:10:20 Laura Nolan ([id number]) -------------------------------------------------------------------------------- Physician Orders Details Patient Name: Laura Nolan Date of Service: 04/08/2017 8:00 AM Medical Record Number: [id number] Patient Account Number: [id number] Date of Birth/Sex: 03-Apr-1970 (47 y.o. Female) Treating RN: Cornell Barman Primary Care Provider: Salome Holmes Other Clinician: Referring Provider: Salome Holmes Treating Provider/Extender: Tito Dine in Treatment: 0 Verbal / Phone Orders: No Diagnosis Coding Wound Cleansing Wound #1 Left Gluteus o Cleanse wound with mild soap and water Wound #2 Left Gluteal fold o Cleanse wound with mild soap and water Skin  Barriers/Peri-Wound Care Wound #1 Left Gluteus o Barrier cream Wound #2 Left Gluteal fold o Barrier cream Primary Wound Dressing Wound #1 Left Gluteus o Silvercel Non-Adherent Wound #2 Left Gluteal fold o Silvercel Non-Adherent Secondary Dressing Wound #1 Left Gluteus o Boardered Foam Dressing Wound #2 Left Gluteal fold o Boardered Foam Dressing Dressing Change Frequency Wound #1 Left Gluteus o Change dressing every other day. Wound #2 Left Gluteal fold o Change dressing every other day. Follow-up Appointments Wound #1 Left Gluteus o Return Appointment in 1 week. Wound #2 Left Gluteal fold o Return Appointment in 1 week. Off-Loading NICKAYLA, MCINNIS ([id number]) Wound #1 Left Gluteus o Other: - Cushion wounded area Wound #2 Left Gluteal fold o Other: - Cushion wounded area Radiation protection practitioner o Ultrasound of guteus Engineer, maintenance) Signed: 04/08/2017 5:38:59 PM By: Gretta Cool, BSN, RN, CWS, Kim RN, BSN Signed: 04/08/2017 5:42:24 PM By: Linton Ham MD Entered By: Gretta Cool, BSN, RN, CWS, Kim on 04/08/2017 08:43:58 Laura Nolan ([id number]) -------------------------------------------------------------------------------- Problem List Details Patient Name: Laura Nolan Date of Service: 04/08/2017 8:00 AM Medical Record Number: [id number] Patient Account Number: [id number] Date of Birth/Sex: February 20, 1970 (47 y.o. Female) Treating RN: Cornell Barman Primary Care Provider: Salome Holmes Other Clinician: Referring Provider: Salome Holmes Treating Provider/Extender: Tito Dine in Treatment: 0 Active Problems ICD-10 Encounter Code Description Active Date Diagnosis L89.302 Pressure ulcer of unspecified buttock, stage 2 04/08/2017 Yes I89.0 Lymphedema, not elsewhere classified 04/08/2017 Yes Inactive Problems Resolved Problems Electronic Signature(s) Signed: 04/08/2017 5:42:24 PM By: Linton Ham MD Entered By: Linton Ham on 04/08/2017  08:57:54 Laura Nolan ([id number]) -------------------------------------------------------------------------------- Progress Note Details Patient Name: Laura Nolan Date of Service: 04/08/2017 8:00 AM Medical Record Number: [id number] Patient Account Number: [id number] Date of Birth/Sex: [birth year] (47 y.o. Female) Treating RN: Cornell Barman Primary Care Provider: Salome Holmes Other Clinician: Referring Provider: Salome Holmes Treating Provider/Extender: Tito Dine in Treatment: 0 Subjective Chief Complaint Information obtained from Patient 04/08/17; patient is here for review of 3 wounds in her posterior left gluteal fold and a draining area in the upper left buttock area History of Present Illness (HPI) 04/08/17;  ADMISSION This is a 47 year old woman with morbid obesity and significant massive lymphedema. Particular area of interest is in the left b. extending into the hip where she has some massive outgrowth of soft tissue pannus with classic skin changes of lymphedema. She was hospitalized from 12/17/17 through 12/22/17 an extensive cellulitis. Initially treated with IV cefazolin and vancomycin and discharged on Keflex and clindamycin. An ultrasound of the area was done because I don't believe they could do a CT scan that did not show an abscess. 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 She also has a small draining area superiorly which constantly drains and soaks her clothing. She states she has some discomfort but no real pain similar to what she had when she had cellulitis in the hospital. She has been using topical antibiotics to the open areas. She noted that she had a temperature yesterday with chills her temperature is 99.6 today in clinic. She  denies cough, dysuria, diarrhea The patient is not a diabetic. She has depression, elevated liver function tests, hypertension, hypothyroidism stage III chronic renal failure. She had a hemoglobin of 9.7 when she is in the hospital I don't see an exact reason for that. Wound History Patient presents with 2 open wounds that have been present for approximately november 2018. Patient has been treating wounds in the following manner: neosporin. Laboratory tests have not been performed in the last month. Patient reportedly has not tested positive for an antibiotic resistant organism. Patient reportedly has not tested positive for osteomyelitis. Patient reportedly has not had testing performed to evaluate circulation in the legs. Patient experiences the following problems associated with their wounds: infection. Patient History Information obtained from Patient. Allergies sulfa, lisinopril, Macrodantin Family History Diabetes - Father, Heart Disease - Mother,Father, Hypertension - Mother,Father, Kidney Disease - Father, No family history of Cancer, Lung Disease, Seizures, Stroke, Thyroid Problems, Tuberculosis. Social History Never smoker, Marital Status - Divorced, Alcohol Use - Rarely, Drug Use - No History, Caffeine Use - Daily. Laura Nolan, Laura Nolan ([id number]) Medical History Eyes Denies history of Cataracts, Glaucoma, Optic Neuritis Ear/Nose/Mouth/Throat Denies history of Chronic sinus problems/congestion, Middle ear problems Hematologic/Lymphatic Patient has history of Lymphedema Denies history of Anemia, Hemophilia, Human Immunodeficiency Virus, Sickle Cell Disease Respiratory Patient has history of Asthma Denies history of Aspiration, Chronic Obstructive Pulmonary Disease (COPD), Pneumothorax, Sleep Apnea, Tuberculosis Cardiovascular Patient has history of Hypertension Denies history of Angina, Arrhythmia, Congestive Heart Failure, Coronary Artery Disease, Deep Vein  Thrombosis, Hypotension, Myocardial Infarction, Peripheral Arterial Disease, Peripheral Venous Disease, Phlebitis, Vasculitis Gastrointestinal Denies history of Cirrhosis , Colitis, Crohn s, Hepatitis A, Hepatitis B, Hepatitis C Endocrine Denies history of Type I Diabetes, Type II Diabetes Genitourinary Denies history of End Stage Renal Disease Immunological Denies history of Lupus Erythematosus, Raynaud s, Scleroderma Musculoskeletal Denies history of Gout, Rheumatoid Arthritis, Osteoarthritis, Osteomyelitis Neurologic Denies history of Dementia, Neuropathy, Quadriplegia, Paraplegia, Seizure Disorder Oncologic Patient has history of Received Chemotherapy Denies history of Received Radiation Review of Systems (ROS) Constitutional Symptoms (General Health) Complains or has symptoms of Fever, Chills. Denies complaints or symptoms of Fatigue, Marked Weight Change. Eyes Complains or has symptoms of Glasses / Contacts - glasses. Denies complaints or symptoms of Dry Eyes, Vision Changes. Ear/Nose/Mouth/Throat Denies complaints or symptoms of Difficult clearing ears, Sinusitis, recent  bells palsy with right side facial dropping Hematologic/Lymphatic Denies complaints or symptoms of Bleeding / Clotting Disorders. Respiratory Complains or has symptoms of Shortness of Breath. Denies complaints or symptoms of Chronic or frequent coughs. Cardiovascular Complains or has symptoms of LE edema. Denies complaints or symptoms of Chest pain. Gastrointestinal Complains or has symptoms of Nausea. Denies complaints or symptoms of Frequent diarrhea, Vomiting. Endocrine Complains or has symptoms of Thyroid disease. Denies complaints or symptoms of Hepatitis, Polydypsia (Excessive Thirst). 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 Integumentary (Skin) Dubose, AmeLie ([id number]) Complains or has symptoms of Wounds, Bleeding or bruising tendency, Breakdown, Swelling. Musculoskeletal Complains or has symptoms of Muscle Weakness. Denies complaints or symptoms of Muscle Pain. Neurologic Denies complaints or symptoms of Numbness/parasthesias, Focal/Weakness. Oncologic endometrial cancer dx 4 years ago with chemo Objective Constitutional Patient is hypertensive.. Pulse regular and within target range for patient.Marland Kitchen Respirations regular, non-labored and within target range.. Patient is febrile today.mild fever at 99.6. morbid obesity, not in overt medical distress. Vitals Time Taken: 8:22 AM, Height: 66 in, Source: Stated, Weight: 500 lbs, Source: Stated, BMI: 80.7, Temperature: 99.6 F, Pulse: 106 bpm, Respiratory Rate: 20 breaths/min, Blood Pressure: 147/90 mmHg. Eyes Conjunctivae clear. No discharge. Respiratory Respiratory effort is easy and symmetric bilaterally. Rate is normal at rest and on room air.. Bilateral breath sounds are clear and equal in all lobes with no wheezes, rales or rhonchi.. Cardiovascular Heart rhythm and rate regular, without murmur or gallop.. Gastrointestinal (GI) morbid obesity with a large lower abdominal pannus. No tenderness. She has a ventral abdominal hernia.. Genitourinary (GU) no overt tenderness or CVA tenderness. Psychiatric history of depression no major issues. General Notes: wound exam; under the lower fold of this patient's buttock on the left. There are 3 closely related ulcers. Base of these all looks clean. No need for debridement. These were not overtly infected. Surrounding this area massive lymphedema. There is some pinkish hue to the skin here but no tenderness this is not overtly infected. The wounds are not overtly infected. dissecting concerning area is on the left buttock medially. A small area leave gain what appears to be edema fluid on a continuous drip  basis. There is no depth and I could not probe this note. No surrounding tenderness. This looks like leaking edema fluid from lymphedema Integumentary (Hair, Skin) widespread lymphedema including her lower extremities massively over the left buttock and hip area. Wound #1 status is Open. Original cause of wound was Gradually Appeared. The wound is located on the Left Gluteus. The wound measures 0.1cm length x 0.1cm width x 0.1cm depth; 0.008cm^2 area and 0.001cm^3 volume. There is no tunneling Laura Nolan, Laura Nolan ([id number]) or undermining noted. There is a large amount of serous drainage noted. The wound margin is flat and intact. There is large (67-100%) red granulation within the wound bed. There is no necrotic tissue within the wound bed. The periwound skin appearance did not exhibit: Callus, Crepitus, Excoriation, Induration, Rash, Scarring, Dry/Scaly, Maceration, Atrophie Blanche, Cyanosis, Ecchymosis, Hemosiderin Staining, Mottled, Pallor, Rubor, Erythema. Wound #2 status is Open. Original cause of wound was Gradually Appeared. The wound is located on the Left Gluteal fold. The wound measures 3cm length x 1.5cm width x 0.1cm depth; 3.534cm^2 area and 0.353cm^3 volume. There is no tunneling or undermining noted. There is a medium amount of serosanguineous drainage noted. The wound margin is indistinct and nonvisible. There  is medium (34-66%) red granulation within the wound bed. There is a medium (34-66%) amount of necrotic tissue within the wound bed including Adherent Slough. The periwound skin appearance exhibited: Erythema. The periwound skin appearance did not exhibit: Callus, Crepitus, Excoriation, Induration, Rash, Scarring, Dry/Scaly, Maceration, Atrophie Blanche, Cyanosis, Ecchymosis, Hemosiderin Staining, Mottled, Pallor, Rubor. The surrounding wound skin color is noted with erythema which is circumferential. Periwound temperature was noted as No Abnormality. The periwound has tenderness  on palpation. Assessment Active Problems ICD-10 L89.302 - Pressure ulcer of unspecified buttock, stage 2 I89.0 - Lymphedema, not elsewhere classified Plan Wound Cleansing: Wound #1 Left Gluteus: Cleanse wound with mild soap and water Wound #2 Left Gluteal fold: Cleanse wound with mild soap and water Skin Barriers/Peri-Wound Care: Wound #1 Left Gluteus: Barrier cream Wound #2 Left Gluteal fold: Barrier cream Primary Wound Dressing: Wound #1 Left Gluteus: Silvercel Non-Adherent Wound #2 Left Gluteal fold: Silvercel Non-Adherent Secondary Dressing: Wound #1 Left Gluteus: Boardered Foam Dressing Wound #2 Left Gluteal fold: Boardered Foam Dressing Dressing Change Frequency: Wound #1 Left Gluteus: Change dressing every other day. Wound #2 Left Gluteal fold: Change dressing every other day. Laura Nolan, Laura Nolan ([id number]) Follow-up Appointments: Wound #1 Left Gluteus: Return Appointment in 1 week. Wound #2 Left Gluteal fold: Return Appointment in 1 week. Off-Loading: Wound #1 Left Gluteus: Other: - Cushion wounded area Wound #2 Left Gluteal fold: Other: - Cushion wounded area ordered were: Ultrasound of guteus #1 we are going use silver alginate border foamto the wounds in the lower left gluteal area o3. #2 alginate ABDs to the draining area in the left buttock #3 looking at this area for the first time is somewhat concerning. She obviously does not have an acute superficial cellulitis/erysipelas the patient's with chronic lymphedema get. However the areas somewhat pink warm to the touch but not tender. I'm going to reorder the ultrasound they did in the hospital looking for an underlying fluid collection #4 we went over the issue of offloading for this patient which will not be easy. She has been attempting to do this after researching her problem on the Internet. Either a large piece of foam or egg crate might be helpful when she is at work. #5 I didn't see an obvious cause  of her temperature of 99.6. I did not put her on antibiotics. In the hospitalization and in November her blood cultures were negative she did have an Escherichia coli UTI. I've advised her that if her temperature goes up she may need to seek more prompt medical attention Electronic Signature(s) Signed: 04/08/2017 5:42:24 PM By: Linton Ham MD Entered By: Linton Ham on 04/08/2017 09:15:17 Laura Nolan ([id number]) -------------------------------------------------------------------------------- ROS/PFSH Details Patient Name: Laura Nolan Date of Service: 04/08/2017 8:00 AM Medical Record Number: [id number] Patient Account Number: [id number] Date of Birth/Sex: 02/06/1970 (47 y.o. Female) Treating RN: Carolyne Fiscal, Debi Primary Care Provider: Salome Holmes Other Clinician: Referring Provider: Salome Holmes Treating Provider/Extender: Tito Dine in Treatment: 0 Information Obtained From Patient Wound History Do you currently have one or more open woundso Yes How many open wounds do you currently haveo 2 Approximately how long have you had your woundso november 2018 How have you been treating your wound(s) until nowo neosporin Has your wound(s) ever healed and then re-openedo No Have you had any lab work done in the past montho No Have you tested positive for an antibiotic resistant organism (MRSA, VRE)o No Have you tested positive for osteomyelitis (bone infection)o No Have you had any tests for circulation  on your legso No Have you had other problems associated with your woundso Infection Constitutional Symptoms (General Health) Complaints and Symptoms: Positive for: Fever; Chills Negative for: Fatigue; Marked Weight Change Eyes Complaints and Symptoms: Positive for: Glasses / Contacts - glasses Negative for: Dry Eyes; Vision Changes Medical History: Negative for: Cataracts; Glaucoma; Optic Neuritis Ear/Nose/Mouth/Throat Complaints and Symptoms: Negative for:  Difficult clearing ears; Sinusitis Review of System Notes: recent bells palsy with right side facial dropping Medical History: Negative for: Chronic sinus problems/congestion; Middle ear problems Hematologic/Lymphatic Complaints and Symptoms: Negative for: Bleeding / Clotting Disorders Medical History: Positive for: Lymphedema Negative for: Anemia; Hemophilia; Human Immunodeficiency Virus; Sickle Cell Disease Respiratory Laura Nolan, Laura Nolan ([id number]) Complaints and Symptoms: Positive for: Shortness of Breath Negative for: Chronic or frequent coughs Medical History: Positive for: Asthma Negative for: Aspiration; Chronic Obstructive Pulmonary Disease (COPD); Pneumothorax; Sleep Apnea; Tuberculosis Cardiovascular Complaints and Symptoms: Positive for: LE edema Negative for: Chest pain Medical History: Positive for: Hypertension Negative for: Angina; Arrhythmia; Congestive Heart Failure; Coronary Artery Disease; Deep Vein Thrombosis; Hypotension; Myocardial Infarction; Peripheral Arterial Disease; Peripheral Venous Disease; Phlebitis; Vasculitis Gastrointestinal Complaints and Symptoms: Positive for: Nausea Negative for: Frequent diarrhea; Vomiting Medical History: Negative for: Cirrhosis ; Colitis; Crohnos; Hepatitis A; Hepatitis B; Hepatitis C Endocrine Complaints and Symptoms: Positive for: Thyroid disease Negative for: Hepatitis; Polydypsia (Excessive Thirst) Medical History: Negative for: Type I Diabetes; Type II Diabetes Genitourinary Complaints and Symptoms: Negative for: Kidney failure/ Dialysis; Incontinence/dribbling Review of System Notes: 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 Medical History: Negative for: End Stage Renal Disease Immunological Complaints and Symptoms: Negative for: Hives; Itching Medical History: Negative for: Lupus Erythematosus; Raynaudos; Scleroderma Integumentary (Skin) Complaints and Symptoms: Positive for: Wounds; Bleeding  or bruising tendency; Breakdown; Swelling Laura Nolan, Laura Nolan ([id number]) Musculoskeletal Complaints and Symptoms: Positive for: Muscle Weakness Negative for: Muscle Pain Medical History: Negative for: Gout; Rheumatoid Arthritis; Osteoarthritis; Osteomyelitis Neurologic Complaints and Symptoms: Negative for: Numbness/parasthesias; Focal/Weakness Medical History: Negative for: Dementia; Neuropathy; Quadriplegia; Paraplegia; Seizure Disorder Oncologic Complaints and Symptoms: Review of System Notes: endometrial cancer dx 4 years ago with chemo Medical History: Positive for: Received Chemotherapy Negative for: Received Radiation Immunizations Pneumococcal Vaccine: Received Pneumococcal Vaccination: No Tetanus Vaccine: Last tetanus shot: 03/27/2016 Implantable Devices Family and Social History Cancer: No; Diabetes: Yes - Father; Heart Disease: Yes - Mother,Father; Hypertension: Yes - Mother,Father; Kidney Disease: Yes - Father; Lung Disease: No; Seizures: No; Stroke: No; Thyroid Problems: No; Tuberculosis: No; Never smoker; Marital Status - Divorced; Alcohol Use: Rarely; Drug Use: No History; Caffeine Use: Daily; Financial Concerns: No; Food, Clothing or Shelter Needs: No; Support System Lacking: No; Transportation Concerns: No; Advanced Directives: No; Patient does not want information on Advanced Directives; Do not resuscitate: No; Living Will: No; Medical Power of Attorney: No Electronic Signature(s) Signed: 04/08/2017 8:38:15 AM By: Alric Quan Signed: 04/08/2017 5:42:24 PM By: Linton Ham MD Entered By: Alric Quan on 04/08/2017 08:27:32 Laura Nolan, Laura Nolan ([id number]) -------------------------------------------------------------------------------- SuperBill Details Patient Name: Laura Nolan Date of Service: 04/08/2017 Medical Record Number: [id number] Patient Account Number: [id number] Date of Birth/Sex: Jul 27, 1970 (47 y.o. Female) Treating RN: Cornell Barman Primary Care  Provider: Salome Holmes Other Clinician: Referring Provider: Salome Holmes Treating Provider/Extender: Tito Dine in Treatment: 0 Diagnosis Coding ICD-10 Codes Code Description L89.302 Pressure ulcer of unspecified buttock, stage 2 I89.0 Lymphedema, not elsewhere classified Facility Procedures CPT4 Code: 24401027 Description: 99214 - WOUND CARE VISIT-LEV 4 EST PT Modifier: Quantity: 1 Physician Procedures CPT4 Code: 2536644 Description: 03474 - WC PHYS LEVEL  4 - NEW PT ICD-10 Diagnosis Description L89.302 Pressure ulcer of unspecified buttock, stage 2 I89.0 Lymphedema, not elsewhere classified Modifier: Quantity: 1 Electronic Signature(s) Signed: 04/08/2017 5:42:24 PM By: Linton Ham MD Entered By: Linton Ham on 04/08/2017 [place]

## 2017-04-13 ENCOUNTER — Ambulatory Visit: Payer: 59 | Admitting: Physician Assistant

## 2017-04-13 ENCOUNTER — Ambulatory Visit
Admission: RE | Admit: 2017-04-13 | Discharge: 2017-04-13 | Disposition: A | Discharge status: Home / Self Care | Payer: 59 | Source: Ambulatory Visit | Attending: Internal Medicine | Admitting: Internal Medicine

## 2017-04-13 DIAGNOSIS — L0231 Cutaneous abscess of buttock: Secondary | ICD-10-CM | POA: Diagnosis not present

## 2017-04-13 DIAGNOSIS — M7989 Other specified soft tissue disorders: Secondary | ICD-10-CM | POA: Diagnosis not present

## 2017-04-15 ENCOUNTER — Encounter: Payer: 59 | Admitting: Nurse Practitioner

## 2017-04-15 DIAGNOSIS — L8992 Pressure ulcer of unspecified site, stage 2: Secondary | ICD-10-CM | POA: Diagnosis not present

## 2017-04-16 NOTE — Progress Notes (Addendum)
ESTHA, FEW ([id number]) Visit Report for 04/15/2017 Chief Complaint Document Details Patient Name: Laura Nolan, Laura Nolan Date of Service: 04/15/2017 9:00 AM Medical Record Number: [id number] Patient Account Number: [id number] Date of Birth/Sex: 04/03/1970 (47 y.o. F) Treating RN: Cornell Barman Primary Care Provider: Salome Holmes Other Clinician: Referring Provider: Salome Holmes Treating Provider/Extender: Cathie Olden in Treatment: 1 Information Obtained from: Patient Chief Complaint she is here for evaluation of left inferior pannus ulcer Electronic Signature(s) Signed: 04/15/2017 9:35:42 AM By: Lawanda Cousins Entered By: Lawanda Cousins on 04/15/2017 09:35:42 Laura Nolan ([id number]) -------------------------------------------------------------------------------- Debridement Details Patient Name: Laura Nolan Date of Service: 04/15/2017 9:00 AM Medical Record Number: [id number] Patient Account Number: [id number] Date of Birth/Sex: 04-Dec-1970 (47 y.o. F) Treating RN: Cornell Barman Primary Care Provider: Salome Holmes Other Clinician: Referring Provider: Salome Holmes Treating Provider/Extender: Cathie Olden in Treatment: 1 Debridement Performed for Wound #2 Left Gluteal fold Assessment: Performed By: Physician Lawanda Cousins, NP Debridement Type: Open Wound/Selective Debridement Description: Selective Pre-procedure Verification/Time Yes - 09:29 Out Taken: Start Time: 09:29 Pain Control: Other : lidocaine 4% Level: Non-Viable Tissue Total Area Debrided (L x W): 2 (cm) x 1 (cm) = 2 (cm) Tissue and other material Non-Viable, Skin debrided: Instrument: Curette Bleeding: Minimum Hemostasis Achieved: Pressure End Time: 09:29 Procedural Pain: 3 Post Procedural Pain: 0 Response to Treatment: Procedure was tolerated well Post Debridement Measurements of Total Wound Length: (cm) 5.3 Stage: Category/Stage III Width: (cm) 0.8 Depth: (cm) 0.1 Volume: (cm) 0.333 Character  of Wound/Ulcer Post Stable Debridement: Post Procedure Diagnosis Same as Pre-procedure Electronic Signature(s) Signed: 04/15/2017 5:31:19 PM By: Lawanda Cousins Signed: 04/17/2017 4:43:23 PM By: Gretta Cool, BSN, RN, CWS, Kim RN, BSN Entered By: Gretta Cool, BSN, RN, CWS, Kim on 04/15/2017 09:33:01 Laura Nolan ([id number]) -------------------------------------------------------------------------------- HPI Details Patient Name: Laura Nolan Date of Service: 04/15/2017 9:00 AM Medical Record Number: [id number] Patient Account Number: [id number] Date of Birth/Sex: 12-24-1970 (47 y.o. F) Treating RN: Cornell Barman Primary Care Provider: Salome Holmes Other Clinician: Referring Provider: Salome Holmes Treating Provider/Extender: Cathie Olden in Treatment: 1 History of Present Illness HPI Description: 04/08/17; ADMISSION This is a 47 year old woman with morbid obesity and significant massive lymphedema. Particular area of interest is in the left b. extending into the hip where she has some massive outgrowth of soft tissue pannus with classic skin changes of lymphedema. She was hospitalized from 12/17/17 through 12/22/17 an extensive cellulitis. Initially treated with IV cefazolin and vancomycin and discharged on Keflex and clindamycin. An ultrasound of the area was done because I don't believe they could do a CT scan that did not show an abscess. 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 She also has a small draining area superiorly which constantly drains and soaks her clothing. She states she has some discomfort but no real pain similar to what she had when she had cellulitis in the hospital. She has been using topical antibiotics to the open areas. She noted that she had a  temperature yesterday with chills her temperature is 99.6 today in clinic. She denies cough, dysuria, diarrhea The patient is not a diabetic. She has depression, elevated liver function tests, hypertension, hypothyroidism stage III chronic renal failure. She had a hemoglobin of 9.7 when she is in the hospital I don't see an exact reason for  that. 04/15/17 she is here for evaluation for a left inferior pannus ulcer (cluster 3) and drainage from her left gluteal/hip lymphedema. The transabdominal ultrasound performed on 3/18 revealed scattered subcutaneous edema at the site of clinical concern in the left buttock without discrete abscess collection or mass. She saw her primary care last Thursday who initiated doxycycline, she will be completing this on/about this weekend. The draining area to her left gluteal/hip lymphedema has completely resolved. She did receive products but was unsure of correct application. She states she has been applying what sounds like Silvadene. She has now been correctly advised will transition to our orders for silver alginate. She has made modifications to her work environment which provides appropriate offloading. She will follow-up next week Electronic Signature(s) Signed: 04/15/2017 9:38:10 AM By: Lawanda Cousins Previous Signature: 04/15/2017 9:10:43 AM Version By: Lawanda Cousins Previous Signature: 04/15/2017 8:20:38 AM Version By: Lawanda Cousins Entered By: Lawanda Cousins on 04/15/2017 09:38:10 Laura Nolan ([id number]) -------------------------------------------------------------------------------- Physician Orders Details Patient Name: Laura Nolan Date of Service: 04/15/2017 9:00 AM Medical Record Number: [id number] Patient Account Number: [id number] Date of Birth/Sex: Oct 10, 1970 (47 y.o. F) Treating RN: Cornell Barman Primary Care Provider: Salome Holmes Other Clinician: Referring Provider: Salome Holmes Treating Provider/Extender: Cathie Olden in Treatment:  1 Verbal / Phone Orders: No Diagnosis Coding ICD-10 Coding Code Description L89.302 Pressure ulcer of unspecified buttock, stage 2 I89.0 Lymphedema, not elsewhere classified Wound Cleansing Wound #2 Left Gluteal fold o Cleanse wound with mild soap and water Primary Wound Dressing Wound #2 Left Gluteal fold o Silvercel Non-Adherent Secondary Dressing Wound #2 Left Gluteal fold o Boardered Foam Dressing Dressing Change Frequency Wound #2 Left Gluteal fold o Change dressing every other day. Follow-up Appointments Wound #2 Left Gluteal fold o Return Appointment in 1 week. Off-Loading Wound #2 Left Gluteal fold o Other: - Cushion wounded area Engineer, maintenance) Signed: 04/15/2017 5:31:19 PM By: Lawanda Cousins Signed: 04/17/2017 4:43:23 PM By: Gretta Cool, BSN, RN, CWS, Kim RN, BSN Entered By: Gretta Cool, BSN, RN, CWS, Kim on 04/15/2017 09:35:36 Laura Nolan ([id number]) -------------------------------------------------------------------------------- Problem List Details Patient Name: Laura Nolan Date of Service: 04/15/2017 9:00 AM Medical Record Number: [id number] Patient Account Number: [id number] Date of Birth/Sex: 03/04/1970 (47 y.o. F) Treating RN: Cornell Barman Primary Care Provider: Salome Holmes Other Clinician: Referring Provider: Salome Holmes Treating Provider/Extender: Cathie Olden in Treatment: 1 Active Problems ICD-10 Impacting Encounter Code Description Active Date Wound Healing Diagnosis L89.302 Pressure ulcer of unspecified buttock, stage 2 04/08/2017 Yes I89.0 Lymphedema, not elsewhere classified 04/08/2017 Yes Inactive Problems Resolved Problems Electronic Signature(s) Signed: 04/15/2017 9:34:54 AM By: Lawanda Cousins Entered By: Lawanda Cousins on 04/15/2017 09:34:53 Laura Nolan ([id number]) -------------------------------------------------------------------------------- Progress Note Details Patient Name: Laura Nolan Date of Service: 04/15/2017  9:00 AM Medical Record Number: [id number] Patient Account Number: [id number] Date of Birth/Sex: April 04, 1970 (47 y.o. F) Treating RN: Cornell Barman Primary Care Provider: Salome Holmes Other Clinician: Referring Provider: Salome Holmes Treating Provider/Extender: Cathie Olden in Treatment: 1 Subjective Chief Complaint Information obtained from Patient she is here for evaluation of left inferior pannus ulcer History of Present Illness (HPI) 04/08/17; ADMISSION This is a 47 year old woman with morbid obesity and significant massive lymphedema. Particular area of interest is in the left b. extending into the hip where she has some massive outgrowth of soft tissue pannus with classic skin changes of lymphedema. She was hospitalized from 12/17/17 through 12/22/17 an extensive cellulitis. Initially treated with IV cefazolin and vancomycin and discharged on Keflex and clindamycin. An ultrasound of  the area was done because I don't believe they could do a CT scan that did not show an abscess. 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 She also has a small draining area superiorly which constantly drains and soaks her clothing. She states she has some discomfort but no real pain similar to what she had when she had cellulitis in the hospital. She has been using topical antibiotics to the open areas. She noted that she had a temperature yesterday with chills her temperature is 99.6 today in clinic. She denies cough, dysuria, diarrhea The patient is not a diabetic. She has depression, elevated liver function tests, hypertension, hypothyroidism stage III chronic renal failure. She had a hemoglobin of 9.7 when she is in the hospital I don't see an exact reason for that. 04/15/17 she  is here for evaluation for a left inferior pannus ulcer (cluster 3) and drainage from her left gluteal/hip lymphedema. The transabdominal ultrasound performed on 3/18 revealed scattered subcutaneous edema at the site of clinical concern in the left buttock without discrete abscess collection or mass. She saw her primary care last Thursday who initiated doxycycline, she will be completing this on/about this weekend. The draining area to her left gluteal/hip lymphedema has completely resolved. She did receive products but was unsure of correct application. She states she has been applying what sounds like Silvadene. She has now been correctly advised will transition to our orders for silver alginate. She has made modifications to her work environment which provides appropriate offloading. She will follow-up next week Patient History Information obtained from Patient. Family History Diabetes - Father, Heart Disease - Mother,Father, Hypertension - Mother,Father, Kidney Disease - Father, No family history of Cancer, Lung Disease, Seizures, Stroke, Thyroid Problems, Tuberculosis. Social History Never smoker, Marital Status - Divorced, Alcohol Use - Rarely, Drug Use - No History, Caffeine Use - Daily. Laura Nolan, Laura Nolan ([id number]) Objective Constitutional Vitals Time Taken: 9:01 AM, Height: 66 in, Weight: 500 lbs, BMI: 80.7, Temperature: 98.2 F, Pulse: 73 bpm, Respiratory Rate: 18 breaths/min, Blood Pressure: 152/70 mmHg. Integumentary (Hair, Skin) Wound #1 status is Healed - Epithelialized. Original cause of wound was Gradually Appeared. The wound is located on the Left Gluteus. The wound measures 0cm length x 0cm width x 0cm depth; 0cm^2 area and 0cm^3 volume. Wound #2 status is Open. Original cause of wound was Gradually Appeared. The wound is located on the Left Gluteal fold. The wound measures 5.3cm length x 0.8cm width x 0.1cm depth; 3.33cm^2 area and 0.333cm^3 volume. There is no tunneling or  undermining noted. There is a medium amount of serosanguineous drainage noted. The wound margin is indistinct and nonvisible. There is large (67-100%) red granulation within the wound bed. There is a small (1-33%) amount of necrotic tissue within the wound bed including Adherent Slough. The periwound skin appearance exhibited: Erythema. The periwound skin appearance did not exhibit: Callus, Crepitus, Excoriation, Induration, Rash, Scarring, Dry/Scaly, Maceration, Atrophie Blanche, Cyanosis, Ecchymosis, Hemosiderin Staining, Mottled, Pallor, Rubor. The surrounding wound skin color is noted with erythema which is circumferential. Periwound temperature was noted as No Abnormality. The periwound has tenderness on palpation. Assessment Active Problems ICD-10 L89.302 - Pressure ulcer of unspecified buttock, stage 2 I89.0 - Lymphedema, not elsewhere classified Procedures Wound #2 Pre-procedure diagnosis of Wound #2  is a Pressure Ulcer located on the Left Gluteal fold . There was a Non-Viable Tissue Open Wound/Selective [phone]) debridement with total area of 2 sq cm performed by Lawanda Cousins, NP. with the following instrument(s): Curette to remove Non-Viable tissue/material including Skin after achieving pain control using Other (lidocaine 4%). A time out was conducted at 09:29, prior to the start of the procedure. A Minimum amount of bleeding was controlled with Pressure. The procedure was tolerated well with a pain level of 3 throughout and a pain level of 0 following the procedure. Post Debridement Measurements: 5.3cm length x 0.8cm width x 0.1cm depth; 0.333cm^3 volume. Post debridement Stage noted as Category/Stage III. Character of Wound/Ulcer Post Debridement is stable. Post procedure Diagnosis Wound #2: Same as Pre-Procedure Laura Nolan, Laura Nolan ([id number]) Plan Wound Cleansing: Wound #2 Left Gluteal fold: Cleanse wound with mild soap and water Primary Wound Dressing: Wound #2 Left  Gluteal fold: Silvercel Non-Adherent Secondary Dressing: Wound #2 Left Gluteal fold: Boardered Foam Dressing Dressing Change Frequency: Wound #2 Left Gluteal fold: Change dressing every other day. Follow-up Appointments: Wound #2 Left Gluteal fold: Return Appointment in 1 week. Off-Loading: Wound #2 Left Gluteal fold: Other: - Cushion wounded area 1. silvercel 2. offload 3. continue doxy per PCP 4. follow up next week Electronic Signature(s) Signed: 04/15/2017 9:38:46 AM By: Lawanda Cousins Entered By: Lawanda Cousins on 04/15/2017 09:38:46 Laura Nolan ([id number]) -------------------------------------------------------------------------------- ROS/PFSH Details Patient Name: Laura Nolan Date of Service: 04/15/2017 9:00 AM Medical Record Number: [id number] Patient Account Number: [id number] Date of Birth/Sex: [birth year] (47 y.o. F) Treating RN: Cornell Barman Primary Care Provider: Salome Holmes Other Clinician: Referring Provider: Salome Holmes Treating Provider/Extender: Cathie Olden in Treatment: 1 Information Obtained From Patient Wound History Do you currently have one or more open woundso Yes How many open wounds do you currently haveo 2 Approximately how long have you had your woundso november 2018 How have you been treating your wound(s) until nowo neosporin Has your wound(s) ever healed and then re-openedo No Have you had any lab work done in the past montho No Have you tested positive for an antibiotic resistant organism (MRSA, VRE)o No Have you tested positive for osteomyelitis (bone infection)o No Have you had any tests for circulation on your legso No Have you had other problems associated with your woundso Infection Eyes Medical History: Negative for: Cataracts; Glaucoma; Optic Neuritis Ear/Nose/Mouth/Throat Medical History: Negative for: Chronic sinus problems/congestion; Middle ear problems Hematologic/Lymphatic Medical History: Positive for:  Lymphedema Negative for: Anemia; Hemophilia; Human Immunodeficiency Virus; Sickle Cell Disease Respiratory Medical History: Positive for: Asthma Negative for: Aspiration; Chronic Obstructive Pulmonary Disease (COPD); Pneumothorax; Sleep Apnea; Tuberculosis Cardiovascular Medical History: Positive for: Hypertension Negative for: Angina; Arrhythmia; Congestive Heart Failure; Coronary Artery Disease; Deep Vein Thrombosis; Hypotension; Myocardial Infarction; Peripheral Arterial Disease; Peripheral Venous Disease; Phlebitis; Vasculitis Gastrointestinal Medical History: Negative for: Cirrhosis ; Colitis; Crohnos; Hepatitis A; Hepatitis B; Hepatitis C Endocrine Laura Nolan, Laura Nolan ([id number]) Medical History: Negative for: Type I Diabetes; Type II Diabetes Genitourinary Medical History: Negative for: End Stage Renal Disease Immunological Medical History: Negative for: Lupus Erythematosus; Raynaudos; Scleroderma Musculoskeletal Medical History: Negative for: Gout; Rheumatoid Arthritis; Osteoarthritis; Osteomyelitis Neurologic Medical History: Negative for: Dementia; Neuropathy; Quadriplegia; Paraplegia; Seizure Disorder Oncologic Medical History: Positive for: Received Chemotherapy Negative for: Received Radiation Immunizations Pneumococcal Vaccine: Received Pneumococcal Vaccination: No Tetanus Vaccine: Last tetanus shot: 03/27/2016 Implantable Devices Family and Social History Cancer: No; Diabetes: Yes - Father; Heart Disease: Yes - Mother,Father; Hypertension: Yes - Mother,Father; Kidney Disease: Yes - Father;  Lung Disease: No; Seizures: No; Stroke: No; Thyroid Problems: No; Tuberculosis: No; Never smoker; Marital Status - Divorced; Alcohol Use: Rarely; Drug Use: No History; Caffeine Use: Daily; Financial Concerns: No; Food, Clothing or Shelter Needs: No; Support System Lacking: No; Transportation Concerns: No; Advanced Directives: No; Patient does not want information on Advanced  Directives; Do not resuscitate: No; Living Will: No; Medical Power of Attorney: No Physician Affirmation I have reviewed and agree with the above information. Electronic Signature(s) Signed: 04/15/2017 5:31:19 PM By: Lawanda Cousins Signed: 04/17/2017 4:43:23 PM By: Gretta Cool, BSN, RN, CWS, Kim RN, BSN Entered By: Lawanda Cousins on 04/15/2017 09:38:17 Laura Nolan, Laura Nolan ([id number]) -------------------------------------------------------------------------------- [place] Details Patient Name: Laura Nolan Date of Service: 04/15/2017 Medical Record Number: [id number] Patient Account Number: [id number] Date of Birth/Sex: July 01, 1970 (47 y.o. F) Treating RN: Cornell Barman Primary Care Provider: Salome Holmes Other Clinician: Referring Provider: Salome Holmes Treating Provider/Extender: Cathie Olden in Treatment: 1 Diagnosis Coding ICD-10 Codes Code Description L89.302 Pressure ulcer of unspecified buttock, stage 2 I89.0 Lymphedema, not elsewhere classified Facility Procedures CPT4 Code: [id number] Description: 10315 - DEBRIDE WOUND 1ST 20 SQ CM OR < ICD-10 Diagnosis Description L89.302 Pressure ulcer of unspecified buttock, stage 2 Modifier: Quantity: 1 Physician Procedures CPT4 Code: [id number] Description: 92446 - WC PHYS DEBR WO ANESTH 20 SQ CM ICD-10 Diagnosis Description L89.302 Pressure ulcer of unspecified buttock, stage 2 Modifier: Quantity: 1 Electronic Signature(s) Signed: 04/15/2017 9:39:15 AM By: Lawanda Cousins Entered By: Lawanda Cousins on 04/15/2017 09:39:15

## 2017-04-19 NOTE — Progress Notes (Signed)
CHRISTL, FESSENDEN ([id number]) Visit Report for 04/15/2017 Arrival Information Details Patient Name: LENYA, STERNE Date of Service: 04/15/2017 9:00 AM Medical Record Number: [id number] Patient Account Number: [id number] Date of Birth/Sex: April 20, 1970 (47 y.o. F) Treating RN: Montey Hora Primary Care Glee Lashomb: Salome Holmes Other Clinician: Referring Niralya Ohanian: Salome Holmes Treating Clinten Howk/Extender: Cathie Olden in Treatment: 1 Visit Information History Since Last Visit Added or deleted any medications: No Patient Arrived: Cane Any new allergies or adverse reactions: No Arrival Time: 09:00 Had a fall or experienced change in No Accompanied By: self activities of daily living that may affect Transfer Assistance: None risk of falls: Patient Identification Verified: Yes Signs or symptoms of abuse/neglect since last visito No Secondary Verification Process Completed: Yes Hospitalized since last visit: No Has Dressing in Place as Prescribed: Yes Pain Present Now: No Electronic Signature(s) Signed: 04/15/2017 5:43:37 PM By: Montey Hora Entered By: Montey Hora on 04/15/2017 [place], [place] ([id number]) -------------------------------------------------------------------------------- Encounter Discharge Information Details Patient Name: [name] Date of Service: 04/15/2017 9:00 AM Medical Record Number: [id number] Patient Account Number: [id number] Date of Birth/Sex: 03-03-1970 (47 y.o. F) Treating RN: Cornell Barman Primary Care Mariangel Ringley: Salome Holmes Other Clinician: Referring Fantasia Jinkins: Salome Holmes Treating Eboni Coval/Extender: Cathie Olden in Treatment: 1 Encounter Discharge Information Items Discharge Pain Level: 0 Discharge Condition: Stable Ambulatory Status: Cane Discharge Destination: Home Transportation: Private Auto Accompanied By: self Schedule Follow-up Appointment: Yes Medication Reconciliation completed and No provided to Patient/Care  Cleda Imel: Provided on Clinical Summary of Care: 04/15/2017 Form Type Recipient Paper Patient VD Electronic Signature(s) Signed: 04/15/2017 10:53:51 AM By: Montey Hora Entered By: Montey Hora on 04/15/2017 10:53:50 [name] ([id number]) -------------------------------------------------------------------------------- Lower Extremity Assessment Details Patient Name: [name] Date of Service: 04/15/2017 9:00 AM Medical Record Number: [id number] Patient Account Number: [id number] Date of Birth/Sex: [birth year] (47 y.o. F) Treating RN: Montey Hora Primary Care Goldman Birchall: Salome Holmes Other Clinician: Referring Ameet Sandy: Salome Holmes Treating Kaly Mcquary/Extender: Lawanda Cousins Weeks in Treatment: 1 Electronic Signature(s) Signed: 04/15/2017 5:43:37 PM By: Montey Hora Entered By: Montey Hora on 04/15/2017 [place], [place] ([id number]) -------------------------------------------------------------------------------- Multi Wound Chart Details Patient Name: [name] Date of Service: 04/15/2017 9:00 AM Medical Record Number: [id number] Patient Account Number: [id number] Date of Birth/Sex: 11-Apr-1970 (47 y.o. F) Treating RN: Cornell Barman Primary Care Redina Zeller: Salome Holmes Other Clinician: Referring Deatrice Spanbauer: Salome Holmes Treating Jaisen Wiltrout/Extender: Cathie Olden in Treatment: 1 Vital Signs Height(in): 66 Pulse(bpm): 73 Weight(lbs): 500 Blood Pressure(mmHg): 152/70 Body Mass Index(BMI): 81 Temperature(F): 98.2 Respiratory Rate 18 (breaths/min): Photos: [N/A:N/A] Wound Location: Left Gluteus Left Gluteal fold N/A Wounding Event: Gradually Appeared Gradually Appeared N/A Primary Etiology: Lymphedema Pressure Ulcer N/A Comorbid History: N/A Lymphedema, Asthma, N/A Hypertension, Received Chemotherapy Date Acquired: 12/18/2016 12/18/2016 N/A Weeks of Treatment: 1 1 N/A Wound Status: Healed - Epithelialized Open N/A Clustered Wound: No Yes  N/A Clustered Quantity: N/A 3 N/A Measurements L x W x D 0x0x0 5.3x0.8x0.1 N/A (cm) Area (cm) : 0 3.33 N/A Volume (cm) : 0 0.333 N/A % Reduction in Area: 100.00% 5.80% N/A % Reduction in Volume: 100.00% 5.70% N/A Classification: Partial Thickness Category/Stage III N/A Exudate Amount: N/A Medium N/A Exudate Type: N/A Serosanguineous N/A Exudate Color: N/A red, brown N/A Wound Margin: N/A Indistinct, nonvisible N/A Granulation Amount: N/A Large (67-100%) N/A Granulation Quality: N/A Red N/A Necrotic Amount: N/A Small (1-33%) N/A Epithelialization: N/A None N/A Debridement: N/A Open Wound/Selective N/A ([id number]) - Selective N/A 09:29 N/A [name] ([id number]) Pre-procedure Verification/Time Out Taken: Pain Control: N/A Other N/A Tissue Debrided: N/A  Skin N/A Level: N/A Non-Viable Tissue N/A Debridement Area (sq cm): N/A 2 N/A Instrument: N/A Curette N/A Bleeding: N/A Minimum N/A Hemostasis Achieved: N/A Pressure N/A Procedural Pain: N/A 3 N/A Post Procedural Pain: N/A 0 N/A Debridement Treatment N/A Procedure was tolerated well N/A Response: Post Debridement N/A 5.3x0.8x0.1 N/A Measurements L x W x D (cm) Post Debridement Volume: N/A 0.333 N/A (cm) Post Debridement Stage: N/A Category/Stage III N/A Periwound Skin Texture: No Abnormalities Noted Excoriation: No N/A Induration: No Callus: No Crepitus: No Rash: No Scarring: No Periwound Skin Moisture: No Abnormalities Noted Maceration: No N/A Dry/Scaly: No Periwound Skin Color: No Abnormalities Noted Erythema: Yes N/A Atrophie Blanche: No Cyanosis: No Ecchymosis: No Hemosiderin Staining: No Mottled: No Pallor: No Rubor: No Erythema Location: N/A Circumferential N/A Temperature: N/A No Abnormality N/A Tenderness on Palpation: No Yes N/A Wound Preparation: N/A Ulcer Cleansing: N/A Rinsed/Irrigated with Saline Topical Anesthetic Applied: Other: lidocaine 4% Procedures Performed: N/A Debridement  N/A Treatment Notes Electronic Signature(s) Signed: 04/15/2017 9:35:00 AM By: Lawanda Cousins Entered By: Lawanda Cousins on 04/15/2017 09:34:59 [name] ([id number]) -------------------------------------------------------------------------------- Multi-Disciplinary Care Plan Details Patient Name: [name] Date of Service: 04/15/2017 9:00 AM Medical Record Number: [id number] Patient Account Number: [id number] Date of Birth/Sex: [birth year] (47 y.o. F) Treating RN: Cornell Barman Primary Care Morgan Rennert: Salome Holmes Other Clinician: Referring Porcia Morganti: Salome Holmes Treating Kingsley Herandez/Extender: Cathie Olden in Treatment: 1 Active Inactive ` Abuse / Safety / Falls / Self Care Management Nursing Diagnoses: Impaired physical mobility Goals: Patient will remain injury free related to falls Date Initiated: 04/08/2017 Target Resolution Date: 05/09/2017 Goal Status: Active Patient/caregiver will verbalize/demonstrate measures taken to prevent injury and/or falls Date Initiated: 04/08/2017 Target Resolution Date: 05/09/2017 Goal Status: Active Interventions: Podiatry chair, stretcher in low position and side rails up as needed Notes: ` Orientation to the Wound Care Program Nursing Diagnoses: Knowledge deficit related to the wound healing center program Goals: Patient/caregiver will verbalize understanding of the [place] Date Initiated: 04/08/2017 Target Resolution Date: 05/09/2017 Goal Status: Active Interventions: Provide education on orientation to the wound center Notes: ` Pressure Nursing Diagnoses: Knowledge deficit related to management of pressures ulcers Goals: Patient will remain free from development of additional pressure ulcers Date Initiated: 04/08/2017 Target Resolution Date: 05/15/2017 ESTEL, TONELLI ([id number]) Goal Status: Active Interventions: Assess: immobility, friction, shearing, incontinence upon admission and as  needed Notes: ` Wound/Skin Impairment Nursing Diagnoses: Impaired tissue integrity Knowledge deficit related to smoking impact on wound healing Goals: Ulcer/skin breakdown will heal within 14 weeks Date Initiated: 04/08/2017 Target Resolution Date: 07/26/2017 Goal Status: Active Interventions: Assess ulceration(s) every visit Treatment Activities: Skin care regimen initiated : 04/08/2017 Notes: Electronic Signature(s) Signed: 04/17/2017 4:43:23 PM By: Gretta Cool, BSN, RN, CWS, Kim RN, BSN Entered By: Gretta Cool, BSN, RN, CWS, Kim on 04/15/2017 09:30:45 [name] ([id number]) -------------------------------------------------------------------------------- Pain Assessment Details Patient Name: [name] Date of Service: 04/15/2017 9:00 AM Medical Record Number: [id number] Patient Account Number: [id number] Date of Birth/Sex: 1970-03-24 (47 y.o. F) Treating RN: Montey Hora Primary Care Shallen Luedke: Salome Holmes Other Clinician: Referring Daijha Leggio: Salome Holmes Treating Kadyn Guild/Extender: Cathie Olden in Treatment: 1 Active Problems Location of Pain Severity and Description of Pain Patient Has Paino No Site Locations Pain Management and Medication Current Pain Management: Electronic Signature(s) Signed: 04/15/2017 5:43:37 PM By: Montey Hora Entered By: Montey Hora on 04/15/2017 09:01:19 [name] ([id number]) -------------------------------------------------------------------------------- Patient/Caregiver Education Details Patient Name: [name] Date of Service: 04/15/2017 9:00 AM Medical Record Number: [id number] Patient Account Number: [id number] Date  of Birth/Gender: 08-28-1970 (47 y.o. F) Treating RN: Montey Hora Primary Care Physician: Salome Holmes Other Clinician: Referring Physician: Salome Holmes Treating Physician/Extender: Cathie Olden in Treatment: 1 Education Assessment Education Provided To: Patient Education Topics  Provided Wound/Skin Impairment: Handouts: Other: wound care as ordered Methods: Explain/Verbal Responses: State content correctly Electronic Signature(s) Signed: 04/15/2017 5:43:37 PM By: Montey Hora Entered By: Montey Hora on 04/15/2017 10:54:03 [name] ([id number]) -------------------------------------------------------------------------------- Wound Assessment Details Patient Name: [name] Date of Service: 04/15/2017 9:00 AM Medical Record Number: [id number] Patient Account Number: [id number] Date of Birth/Sex: Sep 09, 1970 (47 y.o. F) Treating RN: Montey Hora Primary Care Sarath Privott: Salome Holmes Other Clinician: Referring Edithe Dobbin: Salome Holmes Treating Zayah Keilman/Extender: Lawanda Cousins Weeks in Treatment: 1 Wound Status Wound Number: 1 Primary Etiology: Lymphedema Wound Location: Left Gluteus Wound Status: Healed - Epithelialized Wounding Event: Gradually Appeared Date Acquired: 12/18/2016 Weeks Of Treatment: 1 Clustered Wound: No Photos Photo Uploaded By: Montey Hora on 04/15/2017 09:15:46 Wound Measurements Length: (cm) 0 Width: (cm) 0 Depth: (cm) 0 Area: (cm) 0 Volume: (cm) 0 % Reduction in Area: 100% % Reduction in Volume: 100% Wound Description Classification: Partial Thickness Periwound Skin Texture Texture Color No Abnormalities Noted: No No Abnormalities Noted: No Moisture No Abnormalities Noted: No Electronic Signature(s) Signed: 04/15/2017 5:43:37 PM By: Montey Hora Entered By: Montey Hora on 04/15/2017 09:04:07 [name] ([id number]) -------------------------------------------------------------------------------- Wound Assessment Details Patient Name: [name] Date of Service: 04/15/2017 9:00 AM Medical Record Number: [id number] Patient Account Number: [id number] Date of Birth/Sex: 1970/06/10 (47 y.o. F) Treating RN: Montey Hora Primary Care Jazilyn Siegenthaler: Salome Holmes Other Clinician: Referring Timberlyn Pickford: Salome Holmes Treating Kaj Vasil/Extender: Cathie Olden in Treatment: 1 Wound Status Wound Number: 2 Primary Pressure Ulcer Etiology: Wound Location: Left Gluteal fold Wound Status: Open Wounding Event: Gradually Appeared Comorbid Lymphedema, Asthma, Hypertension, Date Acquired: 12/18/2016 History: Received Chemotherapy Weeks Of Treatment: 1 Clustered Wound: Yes Photos Photo Uploaded By: Montey Hora on 04/15/2017 09:15:47 Wound Measurements Length: (cm) 5.3 Width: (cm) 0.8 Depth: (cm) 0.1 Clustered Quantity: 3 Area: (cm) 3.33 Volume: (cm) 0.333 % Reduction in Area: 5.8% % Reduction in Volume: 5.7% Epithelialization: None Tunneling: No Undermining: No Wound Description Classification: Category/Stage III Wound Margin: Indistinct, nonvisible Exudate Amount: Medium Exudate Type: Serosanguineous Exudate Color: red, brown Foul Odor After Cleansing: No Slough/Fibrino Yes Wound Bed Granulation Amount: Large (67-100%) Exposed Structure Granulation Quality: Red Fascia Exposed: No Necrotic Amount: Small (1-33%) Fat Layer (Subcutaneous Tissue) Exposed: No Necrotic Quality: Adherent Slough Tendon Exposed: No Muscle Exposed: No Joint Exposed: No Bone Exposed: No Periwound Skin Texture Braman, Keanu ([id number]) Texture Color No Abnormalities Noted: No No Abnormalities Noted: No Callus: No Atrophie Blanche: No Crepitus: No Cyanosis: No Excoriation: No Ecchymosis: No Induration: No Erythema: Yes Rash: No Erythema Location: Circumferential Scarring: No Hemosiderin Staining: No Mottled: No Moisture Pallor: No No Abnormalities Noted: No Rubor: No Dry / Scaly: No Maceration: No Temperature / Pain Temperature: No Abnormality Tenderness on Palpation: Yes Wound Preparation Ulcer Cleansing: Rinsed/Irrigated with Saline Topical Anesthetic Applied: Other: lidocaine 4%, Treatment Notes Wound #2 (Left Gluteal fold) 1. Cleansed with: Clean wound with Normal  Saline 2. Anesthetic Topical Lidocaine 4% cream to wound bed prior to debridement 4. Dressing Applied: Other dressing (specify in notes) 5. Secondary Dressing Applied Bordered Foam Dressing Notes silvercel Electronic Signature(s) Signed: 04/15/2017 5:43:37 PM By: Montey Hora Entered By: Montey Hora on 04/15/2017 09:08:54 [name] ([id number]) -------------------------------------------------------------------------------- Vitals Details Patient Name: [name] Date of Service: 04/15/2017 9:00 AM Medical Record Number: [id number] Patient  Account Number: [id number] Date of Birth/Sex: 1970/05/26 (47 y.o. F) Treating RN: Montey Hora Primary Care Reatha Sur: Salome Holmes Other Clinician: Referring Arienne Gartin: Salome Holmes Treating Shamyra Farias/Extender: Cathie Olden in Treatment: 1 Vital Signs Time Taken: 09:01 Temperature (F): 98.2 Height (in): 66 Pulse (bpm): 73 Weight (lbs): 500 Respiratory Rate (breaths/min): 18 Body Mass Index (BMI): 80.7 Blood Pressure (mmHg): 152/70 Reference Range: 80 - 120 mg / dl Electronic Signature(s) Signed: 04/15/2017 5:43:37 PM By: Montey Hora Entered By: Montey Hora on 04/15/2017 24:82:50

## 2017-04-22 ENCOUNTER — Encounter: Payer: 59 | Admitting: Internal Medicine

## 2017-04-22 DIAGNOSIS — L8992 Pressure ulcer of unspecified site, stage 2: Secondary | ICD-10-CM | POA: Diagnosis not present

## 2017-04-24 NOTE — Progress Notes (Signed)
BARBETTE, MCGLAUN ([id number]) Visit Report for 04/22/2017 Physical Exam Details Patient Name: Laura Nolan, Laura Nolan Date of Service: 04/22/2017 9:15 AM Medical Record Number: [id number] Patient Account Number: [id number] Date of Birth/Sex: 1970-04-20 (47 y.o. F) Treating RN: Cornell Barman Primary Care Provider: Salome Holmes Other Clinician: Referring Provider: Salome Holmes Treating Provider/Extender: Tito Dine in Treatment: 2 Constitutional Patient is hypertensive.. Pulse regular and within target range for patient.Marland Kitchen Respirations regular, non-labored and within target range.. Temperature is normal and within the target range for the patient.Marland Kitchen appears in no distress. Notes wound exam; under the fold of this patient's large buttock lymphedematous pannus are cluster of 3 small wounds. These look better than the last time I saw it. oThe draining area that I saw 2 weeks ago has stopped. oThe skin itself has still the pinkish hue to it however there is no tenderness no warmth this is better than last time. Apparently the primary physician Starleen Blue on doxycycline which probably helped. No evidence of infection today Electronic Signature(s) Signed: 04/23/2017 8:15:41 AM By: Linton Ham MD Entered By: Linton Ham on 04/22/2017 09:51:10 Laura Nolan ([id number]) -------------------------------------------------------------------------------- Physician Orders Details Patient Name: Laura Nolan Date of Service: 04/22/2017 9:15 AM Medical Record Number: [id number] Patient Account Number: [id number] Date of Birth/Sex: 1970/09/18 (47 y.o. F) Treating RN: Cornell Barman Primary Care Provider: Salome Holmes Other Clinician: Referring Provider: Salome Holmes Treating Provider/Extender: Tito Dine in Treatment: 2 Verbal / Phone Orders: No Diagnosis Coding Wound Cleansing Wound #2 Left Gluteal fold o Cleanse wound with mild soap and water Anesthetic (add to Medication List) Wound #2  Left Gluteal fold o Topical Lidocaine 4% cream applied to wound bed prior to debridement (In Clinic Only). Primary Wound Dressing Wound #2 Left Gluteal fold o Silver Alginate Secondary Dressing Wound #2 Left Gluteal fold o Boardered Foam Dressing Dressing Change Frequency Wound #2 Left Gluteal fold o Change dressing every other day. Follow-up Appointments Wound #2 Left Gluteal fold o Return Appointment in 1 week. Off-Loading Wound #2 Left Gluteal fold o Other: - Cushion wounded area Patient Medications Allergies: sulfa, lisinopril, Macrodantin Notifications Medication Indication Start End lidocaine DOSE topical 4 % cream - cream topical Electronic Signature(s) Signed: 04/22/2017 5:06:18 PM By: Gretta Cool, BSN, RN, CWS, Kim RN, BSN Signed: 04/23/2017 8:15:41 AM By: Linton Ham MD Laura Nolan, Laura Nolan ([id number]) Entered By: Gretta Cool, BSN, RN, CWS, Kim on 04/22/2017 09:44:18 Laura Nolan, Laura Nolan ([id number]) -------------------------------------------------------------------------------- Prescription 04/22/2017 Patient Name: Laura Nolan Provider: Ricard Dillon MD Date of Birth: 04/22/1970 NPI#: [id number] Sex: F DEA#: [id number] Phone #: [phone] License #: [id number] Patient Address: [place] [facility] [place], [place] [address] [address] [place], [place] [address] [phone] Allergies sulfa lisinopril Macrodantin Medication Medication: Route: Strength: Form: lidocaine topical 4% cream Class: TOPICAL LOCAL ANESTHETICS Dose: Frequency / Time: Indication: cream topical Number of Refills: Number of Units: 0 Generic Substitution: Start Date: End Date: Administered at [place]: Yes Time Administered: Time Discontinued: Note to Pharmacy: [facility]): Date(s): Electronic Signature(s) Laura Nolan, Laura Nolan ([id number]) Signed: 04/22/2017 5:06:18 PM By:  Gretta Cool, BSN, RN, CWS, Kim RN, BSN Signed: 04/23/2017 8:15:41 AM By: Linton Ham MD Entered By: Gretta Cool, BSN, RN, CWS, Kim on 04/22/2017 09:44:19 Laura Nolan ([id number]) --------------------------------------------------------------------------------  Problem List Details Patient Name: Laura Nolan Date of Service: 04/22/2017 9:15 AM Medical Record Number: [id number] Patient Account Number: [id number] Date of Birth/Sex: Oct 14, 1970 (47 y.o. F) Treating RN: Cornell Barman Primary Care Provider: Salome Holmes Other Clinician:  Referring Provider: Salome Holmes Treating Provider/Extender: Tito Dine in Treatment: 2 Active Problems ICD-10 Impacting Encounter Code Description Active Date Wound Healing Diagnosis L89.302 Pressure ulcer of unspecified buttock, stage 2 04/08/2017 Yes I89.0 Lymphedema, not elsewhere classified 04/08/2017 Yes Inactive Problems Resolved Problems Electronic Signature(s) Signed: 04/23/2017 8:15:41 AM By: Linton Ham MD Entered By: Linton Ham on 04/22/2017 09:47:34 Laura Nolan ([id number]) -------------------------------------------------------------------------------- Progress Note Details Patient Name: Laura Nolan Date of Service: 04/22/2017 9:15 AM Medical Record Number: [id number] Patient Account Number: [id number] Date of Birth/Sex: 06-30-1970 (47 y.o. F) Treating RN: Cornell Barman Primary Care Provider: Salome Holmes Other Clinician: Referring Provider: Salome Holmes Treating Provider/Extender: Tito Dine in Treatment: 2 Subjective Objective Constitutional Patient is hypertensive.. Pulse regular and within target range for patient.Marland Kitchen Respirations regular, non-labored and within target range.. Temperature is normal and within the target range for the patient.Marland Kitchen appears in no distress. Vitals Time Taken: 9:26 AM, Height: 66 in, Weight: 500 lbs, BMI: 80.7, Temperature: 98.3 F, Pulse: 88 bpm, Respiratory Rate: 18 breaths/min,  Blood Pressure: 158/88 mmHg. General Notes: wound exam; under the fold of this patient's large buttock lymphedematous pannus are cluster of 3 small wounds. These look better than the last time I saw it. The draining area that I saw 2 weeks ago has stopped. The skin itself has still the pinkish hue to it however there is no tenderness no warmth this is better than last time. Apparently the primary physician Starleen Blue on doxycycline which probably helped. No evidence of infection today Integumentary (Hair, Skin) Wound #2 status is Open. Original cause of wound was Gradually Appeared. The wound is located on the Left Gluteal fold. The wound measures 6cm length x 3cm width x 0.1cm depth; 14.137cm^2 area and 1.414cm^3 volume. There is no tunneling or undermining noted. There is a medium amount of serosanguineous drainage noted. The wound margin is indistinct and nonvisible. There is large (67-100%) red granulation within the wound bed. There is a small (1-33%) amount of necrotic tissue within the wound bed including Adherent Slough. The periwound skin appearance exhibited: Erythema. The periwound skin appearance did not exhibit: Callus, Crepitus, Excoriation, Induration, Rash, Scarring, Dry/Scaly, Maceration, Atrophie Blanche, Cyanosis, Ecchymosis, Hemosiderin Staining, Mottled, Pallor, Rubor. The surrounding wound skin color is noted with erythema which is circumferential. Periwound temperature was noted as No Abnormality. The periwound has tenderness on palpation. Assessment Active Problems ICD-10 L89.302 - Pressure ulcer of unspecified buttock, stage 2 I89.0 - Lymphedema, not elsewhere classified Laura Nolan, Laura Nolan ([id number]) Plan Wound Cleansing: Wound #2 Left Gluteal fold: Cleanse wound with mild soap and water Anesthetic (add to Medication List): Wound #2 Left Gluteal fold: Topical Lidocaine 4% cream applied to wound bed prior to debridement (In Clinic Only). Primary Wound Dressing: Wound #2  Left Gluteal fold: Silver Alginate Secondary Dressing: Wound #2 Left Gluteal fold: Boardered Foam Dressing Dressing Change Frequency: Wound #2 Left Gluteal fold: Change dressing every other day. Follow-up Appointments: Wound #2 Left Gluteal fold: Return Appointment in 1 week. Off-Loading: Wound #2 Left Gluteal fold: Other: - Cushion wounded area The following medication(s) was prescribed: lidocaine topical 4 % cream cream topical was prescribed at facility #1 we will continue with silver alginate to the cluster of wounds in the left gluteal fold. #2 she's now had cellulitis on 2 different occasions in this area and I wonder whether this is going to be recurrent Electronic Signature(s) Signed: 04/23/2017 8:15:41 AM By: Linton Ham MD Entered By: Linton Ham on 04/22/2017 09:52:17 Laura Nolan, Laura Nolan ([id number]) --------------------------------------------------------------------------------  SuperBill Details Patient Name: Laura Nolan, Laura Nolan Date of Service: 04/22/2017 Medical Record Number: [id number] Patient Account Number: [id number] Date of Birth/Sex: 19-Aug-1970 (47 y.o. F) Treating RN: Cornell Barman Primary Care Provider: Salome Holmes Other Clinician: Referring Provider: Salome Holmes Treating Provider/Extender: Tito Dine in Treatment: 2 Diagnosis Coding ICD-10 Codes Code Description L89.302 Pressure ulcer of unspecified buttock, stage 2 I89.0 Lymphedema, not elsewhere classified Facility Procedures CPT4 Code: 09407680 Description: 99213 - WOUND CARE VISIT-LEV 3 EST PT Modifier: Quantity: 1 Physician Procedures CPT4 Code: 8811031 Description: 59458 - WC PHYS LEVEL 2 - EST PT ICD-10 Diagnosis Description L89.302 Pressure ulcer of unspecified buttock, stage 2 I89.0 Lymphedema, not elsewhere classified Modifier: Quantity: 1 Electronic Signature(s) Signed: 04/23/2017 8:15:41 AM By: Linton Ham MD Entered By: Linton Ham on 04/22/2017 09:52:43

## 2017-04-24 NOTE — Progress Notes (Signed)
LAWANNA, CECERE ([id number]) Visit Report for 04/22/2017 Arrival Information Details Patient Name: Laura Nolan, Laura Nolan Date of Service: 04/22/2017 9:15 AM Medical Record Number: [id number] Patient Account Number: [id number] Date of Birth/Sex: 28-Jul-1970 (47 y.o. F) Treating RN: Montey Hora Primary Care Foy Mungia: Salome Holmes Other Clinician: Referring Falen Lehrmann: Salome Holmes Treating Consandra Laske/Extender: Tito Dine in Treatment: 2 Visit Information History Since Last Visit Added or deleted any medications: No Patient Arrived: Cane Any new allergies or adverse reactions: No Arrival Time: 09:25 Had a fall or experienced change in No Accompanied By: self activities of daily living that may affect Transfer Assistance: None risk of falls: Patient Identification Verified: Yes Signs or symptoms of abuse/neglect since last visito No Secondary Verification Process Completed: Yes Hospitalized since last visit: No Implantable device outside of the clinic excluding No cellular tissue based products placed in the center since last visit: Has Dressing in Place as Prescribed: Yes Pain Present Now: No Electronic Signature(s) Signed: 04/22/2017 4:02:56 PM By: Montey Hora Entered By: Montey Hora on 04/22/2017 09:26:16 Laura Nolan ([id number]) -------------------------------------------------------------------------------- Clinic Level of Care Assessment Details Patient Name: Laura Nolan Date of Service: 04/22/2017 9:15 AM Medical Record Number: [id number] Patient Account Number: [id number] Date of Birth/Sex: August 07, 1970 (47 y.o. F) Treating RN: Cornell Barman Primary Care Corinn Stoltzfus: Salome Holmes Other Clinician: Referring Anwar Sakata: Salome Holmes Treating Jelesa Mangini/Extender: Tito Dine in Treatment: 2 Clinic Level of Care Assessment Items TOOL 4 Quantity Score []  - Use when only an EandM is performed on FOLLOW-UP visit 0 ASSESSMENTS - Nursing Assessment / Reassessment X -  Reassessment of Co-morbidities (includes updates in patient status) 1 10 X- 1 5 Reassessment of Adherence to Treatment Plan ASSESSMENTS - Wound and Skin Assessment / Reassessment X - Simple Wound Assessment / Reassessment - one wound 1 5 []  - 0 Complex Wound Assessment / Reassessment - multiple wounds []  - 0 Dermatologic / Skin Assessment (not related to wound area) ASSESSMENTS - Focused Assessment []  - Circumferential Edema Measurements - multi extremities 0 []  - 0 Nutritional Assessment / Counseling / Intervention []  - 0 Lower Extremity Assessment (monofilament, tuning fork, pulses) []  - 0 Peripheral Arterial Disease Assessment (using hand held doppler) ASSESSMENTS - Ostomy and/or Continence Assessment and Care []  - Incontinence Assessment and Management 0 []  - 0 Ostomy Care Assessment and Management (repouching, etc.) PROCESS - Coordination of Care X - Simple Patient / Family Education for ongoing care 1 15 []  - 0 Complex (extensive) Patient / Family Education for ongoing care []  - 0 Staff obtains Programmer, systems, Records, Test Results / Process Orders []  - 0 Staff telephones HHA, Nursing Homes / Clarify orders / etc []  - 0 Routine Transfer to another Facility (non-emergent condition) []  - 0 Routine Hospital Admission (non-emergent condition) []  - 0 New Admissions / Biomedical engineer / Ordering NPWT, Apligraf, etc. []  - 0 Emergency Hospital Admission (emergent condition) X- 1 10 Simple Discharge Coordination IVEE, POELLNITZ ([id number]) []  - 0 Complex (extensive) Discharge Coordination PROCESS - Special Needs []  - Pediatric / Minor Patient Management 0 []  - 0 Isolation Patient Management []  - 0 Hearing / Language / Visual special needs []  - 0 Assessment of Community assistance (transportation, D/C planning, etc.) []  - 0 Additional assistance / Altered mentation []  - 0 Support Surface(s) Assessment (bed, cushion, seat, etc.) INTERVENTIONS - Wound Cleansing /  Measurement X - Simple Wound Cleansing - one wound 1 5 []  - 0 Complex Wound Cleansing - multiple wounds X- 1 5 Wound Imaging (photographs - any number of  wounds) []  - 0 Wound Tracing (instead of photographs) X- 1 5 Simple Wound Measurement - one wound []  - 0 Complex Wound Measurement - multiple wounds INTERVENTIONS - Wound Dressings []  - Small Wound Dressing one or multiple wounds 0 X- 1 15 Medium Wound Dressing one or multiple wounds []  - 0 Large Wound Dressing one or multiple wounds []  - 0 Application of Medications - topical []  - 0 Application of Medications - injection INTERVENTIONS - Miscellaneous []  - External ear exam 0 []  - 0 Specimen Collection (cultures, biopsies, blood, body fluids, etc.) []  - 0 Specimen(s) / Culture(s) sent or taken to Lab for analysis []  - 0 Patient Transfer (multiple staff / Civil Service fast streamer / Similar devices) []  - 0 Simple Staple / Suture removal (25 or less) []  - 0 Complex Staple / Suture removal (26 or more) []  - 0 Hypo / Hyperglycemic Management (close monitor of Blood Glucose) []  - 0 Ankle / Brachial Index (ABI) - do not check if billed separately X- 1 5 Vital Signs Ryce, Kayona ([id number]) Has the patient been seen at the hospital within the last three years: Yes Total Score: 80 Level Of Care: New/Established - Level 3 Electronic Signature(s) Signed: 04/22/2017 5:06:18 PM By: Gretta Cool, BSN, RN, CWS, Kim RN, BSN Entered By: Gretta Cool, BSN, RN, CWS, Kim on 04/22/2017 09:44:57 Laura Nolan ([id number]) -------------------------------------------------------------------------------- Encounter Discharge Information Details Patient Name: Laura Nolan Date of Service: 04/22/2017 9:15 AM Medical Record Number: [id number] Patient Account Number: [id number] Date of Birth/Sex: [birth year] (47 y.o. F) Treating RN: Cornell Barman Primary Care Tiyonna Sardinha: Salome Holmes Other Clinician: Referring Shawne Bulow: Salome Holmes Treating Lexxus Underhill/Extender: Tito Dine in Treatment: 2 Encounter Discharge Information Items Discharge Pain Level: 0 Discharge Condition: Stable Ambulatory Status: Cane Discharge Destination: Home Transportation: Private Auto Schedule Follow-up Appointment: Yes Medication Reconciliation completed and No provided to Patient/Care Oksana Deberry: Provided on Clinical Summary of Care: 04/22/2017 Form Type Recipient Paper Patient VD Electronic Signature(s) Signed: 04/23/2017 9:24:56 AM By: Ruthine Dose Entered By: Ruthine Dose on 04/22/2017 09:53:50 Laura Nolan ([id number]) -------------------------------------------------------------------------------- Lower Extremity Assessment Details Patient Name: Laura Nolan Date of Service: 04/22/2017 9:15 AM Medical Record Number: [id number] Patient Account Number: [id number] Date of Birth/Sex: [birth year] (47 y.o. F) Treating RN: Montey Hora Primary Care Author Hat[place]: Salome Holmes Other Clinician: Referring Embry Huss: Salome Holmes Treating Alhaji Mcneal/Extender: Tito Dine in Treatment: 2 Electronic Signature(s) Signed: 04/22/2017 4:02:56 PM By: Montey Hora Entered By: Montey Hora on 04/22/2017 09:32:17 Laura Nolan ([id number]) -------------------------------------------------------------------------------- Multi Wound Chart Details Patient Name: Laura Nolan Date of Service: 04/22/2017 9:15 AM Medical Record Number: [id number] Patient Account Number: [id number] Date of Birth/Sex: [birth year] (47 y.o. F) Treating RN: Cornell Barman Primary Care Aahana Elza: Salome Holmes Other Clinician: Referring Avian Greenawalt: Salome Holmes Treating Benett Swoyer/Extender: Tito Dine in Treatment: 2 Vital Signs Height(in): 66 Pulse(bpm): 88 Weight(lbs): 500 Blood Pressure(mmHg): 158/88 Body Mass Index(BMI): 81 Temperature(F): 98.3 Respiratory Rate 18 (breaths/min): Photos: [2:No Photos] [N/A:N/A] Wound Location: [2:Left Gluteal fold] [N/A:N/A] Wounding  Event: [2:Gradually Appeared] [N/A:N/A] Primary Etiology: [2:Pressure Ulcer] [N/A:N/A] Comorbid History: [2:Lymphedema, Asthma, Hypertension, Received Chemotherapy] [N/A:N/A] Date Acquired: [2:12/18/2016] [N/A:N/A] Weeks of Treatment: [2:2] [N/A:N/A] Wound Status: [2:Open] [N/A:N/A] Clustered Wound: [2:Yes] [N/A:N/A] Clustered Quantity: [2:3] [N/A:N/A] Measurements L x W x D [2:6x3x0.1] [N/A:N/A] (cm) Area (cm) : [2:14.137] [N/A:N/A] Volume (cm) : [2:1.414] [N/A:N/A] % Reduction in Area: [2:-300.00%] [N/A:N/A] % Reduction in Volume: [2:-300.60%] [N/A:N/A] Classification: [2:Category/Stage III] [N/A:N/A] Exudate Amount: [2:Medium] [N/A:N/A] Exudate Type: [2:Serosanguineous] [N/A:N/A] Exudate Color: [2:red, brown] [N/A:N/A] Wound Margin: [2:Indistinct,  nonvisible] [N/A:N/A] Granulation Amount: [2:Large (67-100%)] [N/A:N/A] Granulation Quality: [2:Red] [N/A:N/A] Necrotic Amount: [2:Small (1-33%)] [N/A:N/A] Exposed Structures: [2:Fascia: No Fat Layer (Subcutaneous Tissue) Exposed: No Tendon: No Muscle: No Joint: No Bone: No] [N/A:N/A] Epithelialization: [2:None] [N/A:N/A] Periwound Skin Texture: [2:Excoriation: No Induration: No Callus: No] [N/A:N/A] Crepitus: No Rash: No Scarring: No Periwound Skin Moisture: Maceration: No N/A N/A Dry/Scaly: No Periwound Skin Color: Erythema: Yes N/A N/A Atrophie Blanche: No Cyanosis: No Ecchymosis: No Hemosiderin Staining: No Mottled: No Pallor: No Rubor: No Erythema Location: Circumferential N/A N/A Temperature: No Abnormality N/A N/A Tenderness on Palpation: Yes N/A N/A Wound Preparation: Ulcer Cleansing: N/A N/A Rinsed/Irrigated with Saline Topical Anesthetic Applied: Other: lidocaine 4% Treatment Notes Electronic Signature(s) Signed: 04/23/2017 8:15:41 AM By: Linton Ham MD Entered By: Linton Ham on 04/22/2017 09:47:56 Laura Nolan  ([id number]) -------------------------------------------------------------------------------- [place] Details Patient Name: Laura Nolan Date of Service: 04/22/2017 9:15 AM Medical Record Number: [id number] Patient Account Number: [id number] Date of Birth/Sex: 03-26-1970 (47 y.o. F) Treating RN: Cornell Barman Primary Care Nari Vannatter: Salome Holmes Other Clinician: Referring Karver Fadden: Salome Holmes Treating Orrie Lascano/Extender: Tito Dine in Treatment: 2 Active Inactive ` Abuse / Safety / Falls / Self Care Management Nursing Diagnoses: Impaired physical mobility Goals: Patient will remain injury free related to falls Date Initiated: 04/08/2017 Target Resolution Date: 05/09/2017 Goal Status: Active Patient/caregiver will verbalize/demonstrate measures taken to prevent injury and/or falls Date Initiated: 04/08/2017 Target Resolution Date: 05/09/2017 Goal Status: Active Interventions: Podiatry chair, stretcher in low position and side rails up as needed Notes: ` Orientation to the Wound Care Program Nursing Diagnoses: Knowledge deficit related to the wound healing center program Goals: Patient/caregiver will verbalize understanding of the [facility] Date Initiated: 04/08/2017 Target Resolution Date: 05/09/2017 Goal Status: Active Interventions: Provide education on orientation to the wound center Notes: ` Pressure Nursing Diagnoses: Knowledge deficit related to management of pressures ulcers Goals: Patient will remain free from development of additional pressure ulcers Date Initiated: 04/08/2017 Target Resolution Date: 05/15/2017 ASHLEYANNE, HEMMINGWAY ([id number]) Goal Status: Active Interventions: Assess: immobility, friction, shearing, incontinence upon admission and as needed Notes: ` Wound/Skin Impairment Nursing Diagnoses: Impaired tissue integrity Knowledge deficit related to smoking impact on wound healing Goals: Ulcer/skin  breakdown will heal within 14 weeks Date Initiated: 04/08/2017 Target Resolution Date: 07/26/2017 Goal Status: Active Interventions: Assess ulceration(s) every visit Treatment Activities: Skin care regimen initiated : 04/08/2017 Notes: Electronic Signature(s) Signed: 04/22/2017 5:06:18 PM By: Gretta Cool, BSN, RN, CWS, Kim RN, BSN Entered By: Gretta Cool, BSN, RN, CWS, Kim on 04/22/2017 09:42:32 Laura Nolan ([id number]) -------------------------------------------------------------------------------- Pain Assessment Details Patient Name: Laura Nolan Date of Service: 04/22/2017 9:15 AM Medical Record Number: [id number] Patient Account Number: [id number] Date of Birth/Sex: Jun 26, 1970 (47 y.o. F) Treating RN: Montey Hora Primary Care Keyden Pavlov: Salome Holmes Other Clinician: Referring Abem Shaddix: Salome Holmes Treating Elspeth Blucher/Extender: Tito Dine in Treatment: 2 Active Problems Location of Pain Severity and Description of Pain Patient Has Paino No Site Locations Pain Management and Medication Current Pain Management: Notes Topical or injectable lidocaine is offered to patient for acute pain when surgical debridement is performed. If needed, Patient is instructed to use over the counter pain medication for the following 24-48 hours after debridement. Wound care MDs do not prescribed pain medications. Patient has chronic pain or uncontrolled pain. Patient has been instructed to make an appointment with their Primary Care Physician for pain management. Electronic Signature(s) Signed: 04/22/2017 4:02:56 PM By: Montey Hora Entered By: Montey Hora on 04/22/2017 09:26:29 Laura Nolan ([id number]) --------------------------------------------------------------------------------  Patient/Caregiver Education Details Patient Name: ELI, ADAMI Date of Service: 04/22/2017 9:15 AM Medical Record Number: [id number] Patient Account Number: [id number] Date of Birth/Gender: [birth year] (47 y.o.  F) Treating RN: Cornell Barman Primary Care Physician: Salome Holmes Other Clinician: Referring Physician: Salome Holmes Treating Physician/Extender: Tito Dine in Treatment: 2 Education Assessment Education Provided To: Patient Education Topics Provided Wound Debridement: Handouts: Wound Debridement Methods: Explain/Verbal Responses: State content correctly Wound/Skin Impairment: Handouts: Caring for Your Ulcer Responses: State content correctly Electronic Signature(s) Signed: 04/22/2017 5:06:18 PM By: Gretta Cool, BSN, RN, CWS, Kim RN, BSN Entered By: Gretta Cool, BSN, RN, CWS, Kim on 04/22/2017 09:54:33 Laura Nolan ([id number]) -------------------------------------------------------------------------------- Wound Assessment Details Patient Name: Laura Nolan Date of Service: 04/22/2017 9:15 AM Medical Record Number: [id number] Patient Account Number: [id number] Date of Birth/Sex: 02-11-1970 (47 y.o. F) Treating RN: Montey Hora Primary Care Antonisha Waskey: Salome Holmes Other Clinician: Referring Mersades Barbaro: Salome Holmes Treating Kelsa Jaworowski/Extender: Tito Dine in Treatment: 2 Wound Status Wound Number: 2 Primary Pressure Ulcer Etiology: Wound Location: Left Gluteal fold Wound Status: Open Wounding Event: Gradually Appeared Comorbid Lymphedema, Asthma, Hypertension, Date Acquired: 12/18/2016 History: Received Chemotherapy Weeks Of Treatment: 2 Clustered Wound: Yes Photos Photo Uploaded By: Alric Quan on 04/22/2017 15:41:02 Wound Measurements Length: (cm) 6 Width: (cm) 3 Depth: (cm) 0.1 Clustered Quantity: 3 Area: (cm) 14.137 Volume: (cm) 1.414 % Reduction in Area: -300% % Reduction in Volume: -300.6% Epithelialization: None Tunneling: No Undermining: No Wound Description Classification: Category/Stage III Wound Margin: Indistinct, nonvisible Exudate Amount: Medium Exudate Type: Serosanguineous Exudate Color: red, brown Foul Odor After  Cleansing: No Slough/Fibrino Yes Wound Bed Granulation Amount: Large (67-100%) Exposed Structure Granulation Quality: Red Fascia Exposed: No Necrotic Amount: Small (1-33%) Fat Layer (Subcutaneous Tissue) Exposed: No Necrotic Quality: Adherent Slough Tendon Exposed: No Muscle Exposed: No Joint Exposed: No Bone Exposed: No Periwound Skin Texture Schaner, Contrina ([id number]) Texture Color No Abnormalities Noted: No No Abnormalities Noted: No Callus: No Atrophie Blanche: No Crepitus: No Cyanosis: No Excoriation: No Ecchymosis: No Induration: No Erythema: Yes Rash: No Erythema Location: Circumferential Scarring: No Hemosiderin Staining: No Mottled: No Moisture Pallor: No No Abnormalities Noted: No Rubor: No Dry / Scaly: No Maceration: No Temperature / Pain Temperature: No Abnormality Tenderness on Palpation: Yes Wound Preparation Ulcer Cleansing: Rinsed/Irrigated with Saline Topical Anesthetic Applied: Other: lidocaine 4%, Treatment Notes Wound #2 (Left Gluteal fold) 1. Cleansed with: Clean wound with Normal Saline 2. Anesthetic Topical Lidocaine 4% cream to wound bed prior to debridement 4. Dressing Applied: Other dressing (specify in notes) 5. Secondary Dressing Applied Bordered Foam Dressing Notes silvercel Electronic Signature(s) Signed: 04/22/2017 4:02:56 PM By: Montey Hora Entered By: Montey Hora on 04/22/2017 09:32:06 Laura Nolan ([id number]) -------------------------------------------------------------------------------- Vitals Details Patient Name: Laura Nolan Date of Service: 04/22/2017 9:15 AM Medical Record Number: [id number] Patient Account Number: [id number] Date of Birth/Sex: 1970/05/17 (47 y.o. F) Treating RN: Montey Hora Primary Care Geneveive Furness: Salome Holmes Other Clinician: Referring Cristhian Vanhook: Salome Holmes Treating Deneen Slager/Extender: Tito Dine in Treatment: 2 Vital Signs Time Taken: 09:26 Temperature (F):  98.3 Height (in): 66 Pulse (bpm): 88 Weight (lbs): 500 Respiratory Rate (breaths/min): 18 Body Mass Index (BMI): 80.7 Blood Pressure (mmHg): 158/88 Reference Range: 80 - 120 mg / dl Electronic Signature(s) Signed: 04/22/2017 4:02:56 PM By: Montey Hora Entered By: Montey Hora on 04/22/2017 09:29:34

## 2017-04-29 ENCOUNTER — Encounter: Payer: 59 | Attending: Internal Medicine | Admitting: Internal Medicine

## 2017-04-29 DIAGNOSIS — L89322 Pressure ulcer of left buttock, stage 2: Secondary | ICD-10-CM | POA: Diagnosis not present

## 2017-04-29 DIAGNOSIS — Z6841 Body Mass Index (BMI) 40.0 and over, adult: Secondary | ICD-10-CM | POA: Diagnosis not present

## 2017-04-29 DIAGNOSIS — N183 Chronic kidney disease, stage 3 (moderate): Secondary | ICD-10-CM | POA: Diagnosis not present

## 2017-04-29 DIAGNOSIS — I129 Hypertensive chronic kidney disease with stage 1 through stage 4 chronic kidney disease, or unspecified chronic kidney disease: Secondary | ICD-10-CM | POA: Diagnosis not present

## 2017-04-29 DIAGNOSIS — I890 Lymphedema, not elsewhere classified: Secondary | ICD-10-CM | POA: Diagnosis present

## 2017-05-04 NOTE — Progress Notes (Signed)
ROSEA, DORY ([id number]) Visit Report for 04/29/2017 HPI Details Patient Name: Laura Nolan, Laura Nolan Date of Service: 04/29/2017 8:30 AM Medical Record Number: [id number] Patient Account Number: [id number] Date of Birth/Sex: December 18, 1970 (47 y.o. F) Treating RN: Cornell Barman Primary Care Provider: Salome Holmes Other Clinician: Referring Provider: Salome Holmes Treating Provider/Extender: Tito Dine in Treatment: 3 History of Present Illness HPI Description: 04/08/17; ADMISSION This is a 47 year old woman with morbid obesity and significant massive lymphedema. Particular area of interest is in the left b. extending into the hip where she has some massive outgrowth of soft tissue pannus with classic skin changes of lymphedema. She was hospitalized from 12/17/17 through 12/22/17 an extensive cellulitis. Initially treated with IV cefazolin and vancomycin and discharged on Keflex and clindamycin. An ultrasound of the area was done because I don't believe they could do a CT scan that did not show an abscess. 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 She also has a small draining area superiorly which constantly drains and soaks her clothing. She states she has some discomfort but no real pain similar to what she had when she had cellulitis in the hospital. She has been using topical antibiotics to the open areas. She noted that she had a temperature yesterday with chills her temperature is 99.6 today in clinic. She denies cough, dysuria, diarrhea The patient is not a diabetic. She has depression, elevated liver function tests, hypertension, hypothyroidism stage III chronic renal failure. She had a hemoglobin of 9.7 when she is in the hospital I don't see an exact reason for  that. 04/15/17 she is here for evaluation for a left inferior pannus ulcer (cluster 3) and drainage from her left gluteal/hip lymphedema. The transabdominal ultrasound performed on 3/18 revealed scattered subcutaneous edema at the site of clinical concern in the left buttock without discrete abscess collection or mass. She saw her primary care last Thursday who initiated doxycycline, she will be completing this on/about this weekend. The draining area to her left gluteal/hip lymphedema has completely resolved. She did receive products but was unsure of correct application. She states she has been applying what sounds like Silvadene. She has now been correctly advised will transition to our orders for silver alginate. She has made modifications to her work environment which provides appropriate offloading. She will follow-up next week 04/28/17; patient is here for continued follow-up of her left inferior pannus ulcer [cluster of 3] and drainage from her left gluteal hip lymphedema. She is not having any specific drainage. Of the 3 clustered open areas under the panus only 2 are now open and they are smaller. The large pannus itself has a pinkish discoloration however this is certainly not looking infected. Clearly lymphedema with a peau d'orange texture and appearance of the skin Electronic Signature(s) Signed: 04/29/2017 4:49:21 PM By: Linton Ham MD Entered By: Linton Ham on 04/29/2017 09:23:25 Laura Nolan ([id number]) -------------------------------------------------------------------------------- Physical Exam Details Patient Name: Laura Nolan Date of Service: 04/29/2017 8:30 AM Medical Record Number: [id number] Patient Account Number: [id number] Date of Birth/Sex: 1970/11/25 (47 y.o. F) Treating RN: Cornell Barman Primary Care Provider: Salome Holmes Other Clinician: Referring Provider: Salome Holmes Treating Provider/Extender: Tito Dine in Treatment:  3 Constitutional Patient is hypertensive.. Pulse regular and within target range for patient.Marland Kitchen Respirations regular,  non-labored and within target range.. Temperature is normal and within the target range for the patient.Marland Kitchen appears in no distress. Eyes Conjunctivae clear. No discharge. Respiratory Respiratory effort is easy and symmetric bilaterally. Rate is normal at rest and on room air.. Integumentary (Hair, Skin) no systemic skin issue. Psychiatric No evidence of depression, anxiety, or agitation. Calm, cooperative, and communicative. Appropriate interactions and affect.. Notes when exam; under the fold of the patient's large buttock lymphedema pannus of the cluster of 3 small wounds. One of these is healed the other 2 are smaller. Base of these looks healthy. No debridement is required. oThe large pannus itself continues to have displaying slightly pink color however there is no tenderness no warmth and no drainage. From my review of her history she has had 2 episodes of cellulitis here. Electronic Signature(s) Signed: 04/29/2017 4:49:21 PM By: Linton Ham MD Entered By: Linton Ham on 04/29/2017 09:25:34 Laura Nolan ([id number]) -------------------------------------------------------------------------------- Physician Orders Details Patient Name: Laura Nolan Date of Service: 04/29/2017 8:30 AM Medical Record Number: [id number] Patient Account Number: [id number] Date of Birth/Sex: 1970-02-10 (47 y.o. F) Treating RN: Cornell Barman Primary Care Provider: Salome Holmes Other Clinician: Referring Provider: Salome Holmes Treating Provider/Extender: Tito Dine in Treatment: 3 Verbal / Phone Orders: No Diagnosis Coding Wound Cleansing o Cleanse wound with mild soap and water Anesthetic (add to Medication List) o Topical Lidocaine 4% cream applied to wound bed prior to debridement (In Clinic Only). Primary Wound Dressing o Silver Alginate - Left Gluteal  Fold Secondary Dressing o Boardered Foam Dressing Dressing Change Frequency o Change dressing every other day. Follow-up Appointments o Return Appointment in 1 week. Off-Loading o Other: - Cushion wounded area Engineer, maintenance) Signed: 04/29/2017 4:49:21 PM By: Linton Ham MD Signed: 05/04/2017 9:08:37 AM By: Gretta Cool, BSN, RN, CWS, Kim RN, BSN Entered By: Gretta Cool, BSN, RN, CWS, Kim on 04/29/2017 08:55:25 Laura Nolan ([id number]) -------------------------------------------------------------------------------- Problem List Details Patient Name: Laura Nolan Date of Service: 04/29/2017 8:30 AM Medical Record Number: [id number] Patient Account Number: [id number] Date of Birth/Sex: 06-09-1970 (47 y.o. F) Treating RN: Cornell Barman Primary Care Provider: Salome Holmes Other Clinician: Referring Provider: Salome Holmes Treating Provider/Extender: Tito Dine in Treatment: 3 Active Problems ICD-10 Impacting Encounter Code Description Active Date Wound Healing Diagnosis L89.302 Pressure ulcer of unspecified buttock, stage 2 04/08/2017 Yes I89.0 Lymphedema, not elsewhere classified 04/08/2017 Yes Inactive Problems Resolved Problems Electronic Signature(s) Signed: 04/29/2017 4:49:21 PM By: Linton Ham MD Entered By: Linton Ham on 04/29/2017 09:20:55 Laura Nolan ([id number]) -------------------------------------------------------------------------------- Progress Note Details Patient Name: Laura Nolan Date of Service: 04/29/2017 8:30 AM Medical Record Number: [id number] Patient Account Number: [id number] Date of Birth/Sex: Jun 24, 1970 (47 y.o. F) Treating RN: Cornell Barman Primary Care Provider: Salome Holmes Other Clinician: Referring Provider: Salome Holmes Treating Provider/Extender: Tito Dine in Treatment: 3 Subjective History of Present Illness (HPI) 04/08/17; ADMISSION This is a 47 year old woman with morbid obesity and significant massive  lymphedema. Particular area of interest is in the left b. extending into the hip where she has some massive outgrowth of soft tissue pannus with classic skin changes of lymphedema. She was hospitalized from 12/17/17 through 12/22/17 an extensive cellulitis. Initially treated with IV cefazolin and vancomycin and discharged on Keflex and clindamycin. An ultrasound of the area was done because I don't believe they could do a CT scan that did not show an abscess. 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 She also has a small draining area superiorly which constantly drains and soaks her clothing. She states she has some discomfort but no real pain similar to what she had when she had cellulitis in the hospital. She has been using topical antibiotics to the open areas. She noted that she had a temperature yesterday with chills her temperature is 99.6 today in clinic. She denies cough, dysuria, diarrhea The patient is not a diabetic. She has depression, elevated liver function tests, hypertension, hypothyroidism stage III chronic renal failure. She had a hemoglobin of 9.7 when she is in the hospital I don't see an exact reason for that. 04/15/17 she is here for evaluation for a left inferior pannus ulcer (cluster 3) and drainage from her left gluteal/hip lymphedema. The transabdominal ultrasound performed on 3/18 revealed scattered subcutaneous edema at the site of clinical concern in the left buttock without discrete abscess collection or mass. She saw her primary care last Thursday who initiated doxycycline, she will be completing this on/about this weekend. The draining area to her left gluteal/hip lymphedema has completely resolved. She did receive products but was unsure of correct  application. She states she has been applying what sounds like Silvadene. She has now been correctly advised will transition to our orders for silver alginate. She has made modifications to her work environment which provides appropriate offloading. She will follow-up next week 04/28/17; patient is here for continued follow-up of her left inferior pannus ulcer [cluster of 3] and drainage from her left gluteal hip lymphedema. She is not having any specific drainage. Of the 3 clustered open areas under the panus only 2 are now open and they are smaller. The large pannus itself has a pinkish discoloration however this is certainly not looking infected. Clearly lymphedema with a peau d'orange texture and appearance of the skin Objective Constitutional Patient is hypertensive.. Pulse regular and within target range for patient.Marland Kitchen Respirations regular, non-labored and within target range.. Temperature is normal and within the target range for the patient.Marland Kitchen appears in no distress. Vitals Time Taken: 8:42 AM, Height: 66 in, Weight: 500 lbs, BMI: 80.7, Temperature: 98.5 F, Pulse: 87 bpm, Respiratory Rate: 18 breaths/min, Blood Pressure: 168/100 mmHg. Laura Nolan, Laura Nolan ([id number]) Eyes Conjunctivae clear. No discharge. Respiratory Respiratory effort is easy and symmetric bilaterally. Rate is normal at rest and on room air.Marland Kitchen Psychiatric No evidence of depression, anxiety, or agitation. Calm, cooperative, and communicative. Appropriate interactions and affect.. General Notes: when exam; under the fold of the patient's large buttock lymphedema pannus of the cluster of 3 small wounds. One of these is healed the other 2 are smaller. Base of these looks healthy. No debridement is required. The large pannus itself continues to have displaying slightly pink color however there is no tenderness no warmth and no drainage. From my review of her history she has had 2 episodes of cellulitis here. Integumentary (Hair,  Skin) no systemic skin issue. Wound #2 status is Open. Original cause of wound was Gradually Appeared. The wound is located on the Left Gluteal fold. The wound measures 3.4cm length x 2.5cm width x 0.1cm depth; 6.676cm^2 area and 0.668cm^3 volume. There is no tunneling or undermining noted. There is a medium amount of serosanguineous drainage noted. The wound margin is indistinct and nonvisible. There is large (67-100%) red granulation within the wound bed. There is a small (1-33%)  amount of necrotic tissue within the wound bed including Adherent Slough. The periwound skin appearance did not exhibit: Callus, Crepitus, Excoriation, Induration, Rash, Scarring, Dry/Scaly, Maceration, Atrophie Blanche, Cyanosis, Ecchymosis, Hemosiderin Staining, Mottled, Pallor, Rubor, Erythema. Periwound temperature was noted as No Abnormality. The periwound has tenderness on palpation. Assessment Active Problems ICD-10 L89.302 - Pressure ulcer of unspecified buttock, stage 2 I89.0 - Lymphedema, not elsewhere classified Plan Wound Cleansing: Cleanse wound with mild soap and water Anesthetic (add to Medication List): Topical Lidocaine 4% cream applied to wound bed prior to debridement (In Clinic Only). Primary Wound Dressing: Silver Alginate - Left Gluteal Fold Secondary Dressing: Boardered Foam Dressing Dressing Change Frequency: Change dressing every other day. Follow-up Appointments: Laura Nolan, Laura Nolan ([id number]) Return Appointment in 1 week. Off-Loading: Other: - Cushion wounded area #1 we're continuing with silver alginate and border foam to the 2 small open areas that remain #2 the pannus is extremely large but no evidence of current infection #3 her primary physician apparently recommended weekly Hibiclens washes to this area which is I think fine however it is possible she is going to need prophylactic antibiotics if she continues to have episodes of cellulitis/erysipelas involving  this area. Electronic Signature(s) Signed: 04/29/2017 4:49:21 PM By: Linton Ham MD Entered By: Linton Ham on 04/29/2017 09:26:43 Laura Nolan ([id number]) -------------------------------------------------------------------------------- SuperBill Details Patient Name: Laura Nolan Date of Service: 04/29/2017 Medical Record Number: [id number] Patient Account Number: [id number] Date of Birth/Sex: 09/15/1970 (47 y.o. F) Treating RN: Cornell Barman Primary Care Provider: Salome Holmes Other Clinician: Referring Provider: Salome Holmes Treating Provider/Extender: Tito Dine in Treatment: 3 Diagnosis Coding ICD-10 Codes Code Description L89.302 Pressure ulcer of unspecified buttock, stage 2 I89.0 Lymphedema, not elsewhere classified Facility Procedures CPT4 Code: [id number] Description: [phone] - WOUND CARE VISIT-LEV 2 EST PT Modifier: Quantity: 1 Physician Procedures CPT4 Code: [id number] Description: 35361 - WC PHYS LEVEL 3 - EST PT ICD-10 Diagnosis Description I89.0 Lymphedema, not elsewhere classified L89.302 Pressure ulcer of unspecified buttock, stage 2 Modifier: Quantity: 1 Electronic Signature(s) Signed: 04/29/2017 4:49:21 PM By: Linton Ham MD Entered By: Linton Ham on 04/29/2017 09:28:17

## 2017-05-04 NOTE — Progress Notes (Signed)
EVIN, CHIRCO ([id number]) Visit Report for 04/29/2017 Arrival Information Details Patient Name: Laura Nolan, Laura Nolan Date of Service: 04/29/2017 8:30 AM Medical Record Number: [id number] Patient Account Number: [id number] Date of Birth/Sex: March 04, 1970 (47 y.o. F) Treating RN: Laura Nolan Primary Care Laura Nolan: Laura Nolan Other Clinician: Referring Laura Nolan: Laura Nolan Treating Laura Nolan/Extender: Laura Nolan in Treatment: 3 Visit Information History Since Last Visit All ordered tests and consults were completed: No Patient Arrived: Laura Nolan Added or deleted any medications: No Arrival Time: 08:41 Any new allergies or adverse reactions: No Accompanied By: self Had a fall or experienced change in No Transfer Assistance: None activities of daily living that may affect Patient Identification Verified: Yes risk of falls: Secondary Verification Process Completed: Yes Signs or symptoms of abuse/neglect since last visito No Hospitalized since last visit: No Implantable device outside of the clinic excluding No cellular tissue based products placed in the center since last visit: Pain Present Now: No Electronic Signature(s) Signed: 04/29/2017 11:26:12 AM By: Laura Nolan Entered By: Laura Nolan on 04/29/2017 08:41:45 Laura Nolan ([id number]) -------------------------------------------------------------------------------- Clinic Level of Care Assessment Details Patient Name: Laura Nolan Date of Service: 04/29/2017 8:30 AM Medical Record Number: [id number] Patient Account Number: [id number] Date of Birth/Sex: [birth year] (47 y.o. F) Treating RN: Laura Nolan Primary Care Nare Gaspari: Laura Nolan Other Clinician: Referring Laura Nolan: Laura Nolan Treating Laura Nolan/Extender: Laura Nolan in Treatment: 3 Clinic Level of Care Assessment Items TOOL 4 Quantity Score []  - Use when only an EandM is performed on FOLLOW-UP visit 0 ASSESSMENTS - Nursing Assessment /  Reassessment []  - Reassessment of Co-morbidities (includes updates in patient status) 0 X- 1 5 Reassessment of Adherence to Treatment Plan ASSESSMENTS - Wound and Skin Assessment / Reassessment X - Simple Wound Assessment / Reassessment - one wound 1 5 []  - 0 Complex Wound Assessment / Reassessment - multiple wounds []  - 0 Dermatologic / Skin Assessment (not related to wound area) ASSESSMENTS - Focused Assessment []  - Circumferential Edema Measurements - multi extremities 0 []  - 0 Nutritional Assessment / Counseling / Intervention []  - 0 Lower Extremity Assessment (monofilament, tuning fork, pulses) []  - 0 Peripheral Arterial Disease Assessment (using hand held doppler) ASSESSMENTS - Ostomy and/or Continence Assessment and Care []  - Incontinence Assessment and Management 0 []  - 0 Ostomy Care Assessment and Management (repouching, etc.) PROCESS - Coordination of Care X - Simple Patient / Family Education for ongoing care 1 15 []  - 0 Complex (extensive) Patient / Family Education for ongoing care []  - 0 Staff obtains Programmer, systems, Records, Test Results / Process Orders []  - 0 Staff telephones HHA, Nursing Homes / Clarify orders / etc []  - 0 Routine Transfer to another Facility (non-emergent condition) []  - 0 Routine Hospital Admission (non-emergent condition) []  - 0 New Admissions / Biomedical engineer / Ordering NPWT, Apligraf, etc. []  - 0 Emergency Hospital Admission (emergent condition) X- 1 10 Simple Discharge Coordination Laura Nolan ([id number]) []  - 0 Complex (extensive) Discharge Coordination PROCESS - Special Needs []  - Pediatric / Minor Patient Management 0 []  - 0 Isolation Patient Management []  - 0 Hearing / Language / Visual special needs []  - 0 Assessment of Community assistance (transportation, D/C planning, etc.) []  - 0 Additional assistance / Altered mentation []  - 0 Support Surface(s) Assessment (bed, cushion, seat, etc.) INTERVENTIONS - Wound  Cleansing / Measurement X - Simple Wound Cleansing - one wound 1 5 []  - 0 Complex Wound Cleansing - multiple wounds X- 1 5 Wound Imaging (photographs - any number of  wounds) []  - 0 Wound Tracing (instead of photographs) X- 1 5 Simple Wound Measurement - one wound []  - 0 Complex Wound Measurement - multiple wounds INTERVENTIONS - Wound Dressings []  - Small Wound Dressing one or multiple wounds 0 X- 1 15 Medium Wound Dressing one or multiple wounds []  - 0 Large Wound Dressing one or multiple wounds []  - 0 Application of Medications - topical []  - 0 Application of Medications - injection INTERVENTIONS - Miscellaneous []  - External ear exam 0 []  - 0 Specimen Collection (cultures, biopsies, blood, body fluids, etc.) []  - 0 Specimen(s) / Culture(s) sent or taken to Lab for analysis []  - 0 Patient Transfer (multiple staff / Civil Service fast streamer / Similar devices) []  - 0 Simple Staple / Suture removal (25 or less) []  - 0 Complex Staple / Suture removal (26 or more) []  - 0 Hypo / Hyperglycemic Management (close monitor of Blood Glucose) []  - 0 Ankle / Brachial Index (ABI) - do not check if billed separately X- 1 5 Vital Signs Nolan, Laura ([id number]) Has the patient been seen at the hospital within the last three years: Yes Total Score: 70 Level Of Care: New/Established - Level 2 Electronic Signature(s) Signed: 05/04/2017 9:08:37 AM By: Laura Nolan, BSN, RN, CWS, Kim RN, BSN Entered By: Laura Nolan, BSN, RN, CWS, Kim on 04/29/2017 08:55:57 Laura Nolan ([id number]) -------------------------------------------------------------------------------- Encounter Discharge Information Details Patient Name: Laura Nolan Date of Service: 04/29/2017 8:30 AM Medical Record Number: [id number] Patient Account Number: [id number] Date of Birth/Sex: 02-23-1970 (47 y.o. F) Treating RN: Laura Nolan Primary Care Laura Nolan: Laura Nolan Other Clinician: Referring Laura Nolan: Laura Nolan Treating Laura Nolan/Extender:  Laura Nolan in Treatment: 3 Encounter Discharge Information Items Discharge Pain Level: 0 Discharge Condition: Stable Ambulatory Status: Cane Discharge Destination: Home Transportation: Private Auto Accompanied By: self Schedule Follow-up Appointment: Yes Medication Reconciliation completed and No provided to Patient/Care Daphne Karrer: Provided on Clinical Summary of Care: 04/29/2017 Form Type Recipient Paper Patient VD Electronic Signature(s) Signed: 04/29/2017 9:21:33 AM By: Montey Hora Entered By: Montey Hora on 04/29/2017 09:21:33 Laura Nolan ([id number]) -------------------------------------------------------------------------------- Lower Extremity Assessment Details Patient Name: Laura Nolan Date of Service: 04/29/2017 8:30 AM Medical Record Number: [id number] Patient Account Number: [id number] Date of Birth/Sex: Sep 20, 1970 (47 y.o. F) Treating RN: Laura Nolan Primary Care Olaoluwa Grieder: Laura Nolan Other Clinician: Referring Imaya Duffy: Laura Nolan Treating Denesia Donelan/Extender: Laura Nolan in Treatment: 3 Electronic Signature(s) Signed: 04/29/2017 11:26:12 AM By: Laura Nolan Entered By: Laura Nolan on 04/29/2017 08:49:22 Laura Nolan ([id number]) -------------------------------------------------------------------------------- Multi Wound Chart Details Patient Name: Laura Nolan Date of Service: 04/29/2017 8:30 AM Medical Record Number: [id number] Patient Account Number: [id number] Date of Birth/Sex: [birth year] (47 y.o. F) Treating RN: Laura Nolan Primary Care Amylee Lodato: Laura Nolan Other Clinician: Referring Lorin Gawron: Laura Nolan Treating Arriel Victor/Extender: Laura Nolan in Treatment: 3 Vital Signs Height(in): 66 Pulse(bpm): 87 Weight(lbs): 500 Blood Pressure(mmHg): 168/100 Body Mass Index(BMI): 81 Temperature(F): 98.5 Respiratory Rate 18 (breaths/min): Photos: [2:No Photos] [N/A:N/A] Wound Location: [2:Left  Gluteal fold] [N/A:N/A] Wounding Event: [2:Gradually Appeared] [N/A:N/A] Primary Etiology: [2:Pressure Ulcer] [N/A:N/A] Comorbid History: [2:Lymphedema, Asthma, Hypertension, Received Chemotherapy] [N/A:N/A] Date Acquired: [2:12/18/2016] [N/A:N/A] Weeks of Treatment: [2:3] [N/A:N/A] Wound Status: [2:Open] [N/A:N/A] Clustered Wound: [2:Yes] [N/A:N/A] Clustered Quantity: [2:3] [N/A:N/A] Measurements L x W x D [2:3.4x2.5x0.1] [N/A:N/A] (cm) Area (cm) : [2:6.676] [N/A:N/A] Volume (cm) : [2:0.668] [N/A:N/A] % Reduction in Area: [2:-88.90%] [N/A:N/A] % Reduction in Volume: [2:-89.20%] [N/A:N/A] Classification: [2:Category/Stage III] [N/A:N/A] Exudate Amount: [2:Medium] [N/A:N/A] Exudate Type: [2:Serosanguineous] [N/A:N/A] Exudate Color: [2:red, brown] [N/A:N/A]  Wound Margin: [2:Indistinct, nonvisible] [N/A:N/A] Granulation Amount: [2:Large (67-100%)] [N/A:N/A] Granulation Quality: [2:Red] [N/A:N/A] Necrotic Amount: [2:Small (1-33%)] [N/A:N/A] Exposed Structures: [2:Fascia: No Fat Layer (Subcutaneous Tissue) Exposed: No Tendon: No Muscle: No Joint: No Bone: No] [N/A:N/A] Epithelialization: [2:Small (1-33%)] [N/A:N/A] Periwound Skin Texture: [2:Excoriation: No Induration: No Callus: No] [N/A:N/A] Crepitus: No Rash: No Scarring: No Periwound Skin Moisture: Maceration: No N/A N/A Dry/Scaly: No Periwound Skin Color: Atrophie Blanche: No N/A N/A Cyanosis: No Ecchymosis: No Erythema: No Hemosiderin Staining: No Mottled: No Pallor: No Rubor: No Temperature: No Abnormality N/A N/A Tenderness on Palpation: Yes N/A N/A Wound Preparation: Ulcer Cleansing: N/A N/A Rinsed/Irrigated with Saline Topical Anesthetic Applied: Other: lidocaine 4% Treatment Notes Wound #2 (Left Gluteal fold) 1. Cleansed with: Clean wound with Normal Saline 2. Anesthetic Topical Lidocaine 4% cream to wound bed prior to debridement 4. Dressing Applied: Other dressing (specify in notes) 5. Secondary  Dressing Applied Bordered Foam Dressing Notes [employer] Electronic Signature(s) Signed: 04/29/2017 4:49:21 PM By: Linton Ham MD Entered By: Linton Ham on 04/29/2017 09:21:20 Laura Nolan ([id number]) -------------------------------------------------------------------------------- Multi-Disciplinary Care Plan Details Patient Name: Laura Nolan Date of Service: 04/29/2017 8:30 AM Medical Record Number: [id number] Patient Account Number: [id number] Date of Birth/Sex: 1970/09/13 (47 y.o. F) Treating RN: Laura Nolan Primary Care Kamran Coker: Laura Nolan Other Clinician: Referring Tamari Busic: Laura Nolan Treating Michale Weikel/Extender: Laura Nolan in Treatment: 3 Active Inactive ` Abuse / Safety / Falls / Self Care Management Nursing Diagnoses: Impaired physical mobility Goals: Patient will remain injury free related to falls Date Initiated: 04/08/2017 Target Resolution Date: 05/09/2017 Goal Status: Active Patient/caregiver will verbalize/demonstrate measures taken to prevent injury and/or falls Date Initiated: 04/08/2017 Target Resolution Date: 05/09/2017 Goal Status: Active Interventions: Podiatry chair, stretcher in low position and side rails up as needed Notes: ` Orientation to the Wound Care Program Nursing Diagnoses: Knowledge deficit related to the wound healing center program Goals: Patient/caregiver will verbalize understanding of the Lane Date Initiated: 04/08/2017 Target Resolution Date: 05/09/2017 Goal Status: Active Interventions: Provide education on orientation to the wound center Notes: ` Pressure Nursing Diagnoses: Knowledge deficit related to management of pressures ulcers Goals: Patient will remain free from development of additional pressure ulcers Date Initiated: 04/08/2017 Target Resolution Date: 05/15/2017 [place], [facility] ([id number]) Goal Status: Active Interventions: Assess: immobility, friction, shearing,  incontinence upon admission and as needed Notes: ` Wound/Skin Impairment Nursing Diagnoses: Impaired tissue integrity Knowledge deficit related to smoking impact on wound healing Goals: Ulcer/skin breakdown will heal within 14 weeks Date Initiated: 04/08/2017 Target Resolution Date: 07/26/2017 Goal Status: Active Interventions: Assess ulceration(s) every visit Treatment Activities: Skin care regimen initiated : 04/08/2017 Notes: Electronic Signature(s) Signed: 05/04/2017 9:08:37 AM By: Laura Nolan, BSN, RN, CWS, Kim RN, BSN Entered By: Laura Nolan, BSN, RN, CWS, Kim on 04/29/2017 08:53:23 Laura Nolan ([id number]) -------------------------------------------------------------------------------- Pain Assessment Details Patient Name: Laura Nolan Date of Service: 04/29/2017 8:30 AM Medical Record Number: [id number] Patient Account Number: [id number] Date of Birth/Sex: [birth year] (47 y.o. F) Treating RN: Laura Nolan Primary Care Janesha Brissette: Laura Nolan Other Clinician: Referring Saretta Dahlem: Laura Nolan Treating Cheila Wickstrom/Extender: Laura Nolan in Treatment: 3 Active Problems Location of Pain Severity and Description of Pain Patient Has Paino No Site Locations Pain Management and Medication Current Pain Management: Electronic Signature(s) Signed: 04/29/2017 11:26:12 AM By: Laura Nolan Entered By: Laura Nolan on 04/29/2017 08:41:52 Laura Nolan ([id number]) -------------------------------------------------------------------------------- Patient/Caregiver Education Details Patient Name: Laura Nolan Date of Service: 04/29/2017 8:30 AM Medical Record Number: [id number] Patient Account Number: [id number] Date of Birth/Gender: 15-Nov-1970 (  47 y.o. F) Treating RN: Montey Hora Primary Care Physician: Laura Nolan Other Clinician: Referring Physician: Salome Nolan Treating Physician/Extender: Laura Nolan in Treatment: 3 Education Assessment Education Provided  To: Patient Education Topics Provided Wound/Skin Impairment: Handouts: Other: wound care as ordered Methods: Demonstration, Explain/Verbal Responses: State content correctly Electronic Signature(s) Signed: 04/29/2017 4:21:37 PM By: Montey Hora Entered By: Montey Hora on 04/29/2017 09:21:52 Laura Nolan ([id number]) -------------------------------------------------------------------------------- Wound Assessment Details Patient Name: Laura Nolan Date of Service: 04/29/2017 8:30 AM Medical Record Number: [id number] Patient Account Number: [id number] Date of Birth/Sex: 1970-02-20 (47 y.o. F) Treating RN: Laura Nolan Primary Care Micalah Cabezas: Laura Nolan Other Clinician: Referring Donabelle Molden: Laura Nolan Treating Odis Turck/Extender: Laura Nolan in Treatment: 3 Wound Status Wound Number: 2 Primary Pressure Ulcer Etiology: Wound Location: Left Gluteal fold Wound Status: Open Wounding Event: Gradually Appeared Comorbid Lymphedema, Asthma, Hypertension, Date Acquired: 12/18/2016 History: Received Chemotherapy Weeks Of Treatment: 3 Clustered Wound: Yes Wound Measurements Length: (cm) 3.4 Width: (cm) 2.5 Depth: (cm) 0.1 Clustered Quantity: 3 Area: (cm) 6.676 Volume: (cm) 0.668 % Reduction in Area: -88.9% % Reduction in Volume: -89.2% Epithelialization: Small (1-33%) Tunneling: No Undermining: No Wound Description Classification: Category/Stage III Wound Margin: Indistinct, nonvisible Exudate Amount: Medium Exudate Type: Serosanguineous Exudate Color: red, brown Foul Odor After Cleansing: No Slough/Fibrino Yes Wound Bed Granulation Amount: Large (67-100%) Exposed Structure Granulation Quality: Red Fascia Exposed: No Necrotic Amount: Small (1-33%) Fat Layer (Subcutaneous Tissue) Exposed: No Necrotic Quality: Adherent Slough Tendon Exposed: No Muscle Exposed: No Joint Exposed: No Bone Exposed: No Periwound Skin Texture Texture Color No  Abnormalities Noted: No No Abnormalities Noted: No Callus: No Atrophie Blanche: No Crepitus: No Cyanosis: No Excoriation: No Ecchymosis: No Induration: No Erythema: No Rash: No Hemosiderin Staining: No Scarring: No Mottled: No Pallor: No Moisture Rubor: No No Abnormalities Noted: No Dry / Scaly: No Temperature / Pain Maceration: No Temperature: No Abnormality Alcorn, Symone ([id number]) Tenderness on Palpation: Yes Wound Preparation Ulcer Cleansing: Rinsed/Irrigated with Saline Topical Anesthetic Applied: Other: lidocaine 4%, Treatment Notes Wound #2 (Left Gluteal fold) 1. Cleansed with: Clean wound with Normal Saline 2. Anesthetic Topical Lidocaine 4% cream to wound bed prior to debridement 4. Dressing Applied: Other dressing (specify in notes) 5. Secondary Dressing Applied Bordered Foam Dressing Notes [employer] Electronic Signature(s) Signed: 04/29/2017 11:26:12 AM By: Laura Nolan Entered By: Laura Nolan on 04/29/2017 08:49:09 Laura Nolan ([id number]) -------------------------------------------------------------------------------- Vitals Details Patient Name: Laura Nolan Date of Service: 04/29/2017 8:30 AM Medical Record Number: [id number] Patient Account Number: [id number] Date of Birth/Sex: [birth year] (47 y.o. F) Treating RN: Laura Nolan Primary Care Marysue Fait: Laura Nolan Other Clinician: Referring Owen Pratte: Laura Nolan Treating Azora Bonzo/Extender: Laura Nolan in Treatment: 3 Vital Signs Time Taken: 08:42 Temperature (F): 98.5 Height (in): 66 Pulse (bpm): 87 Weight (lbs): 500 Respiratory Rate (breaths/min): 18 Body Mass Index (BMI): 80.7 Blood Pressure (mmHg): 168/100 Reference Range: 80 - 120 mg / dl Electronic Signature(s) Signed: 04/29/2017 11:26:12 AM By: Laura Nolan Entered By: Laura Nolan on 04/29/2017 08:43:18

## 2017-05-06 ENCOUNTER — Encounter: Payer: 59 | Admitting: Internal Medicine

## 2017-05-06 DIAGNOSIS — I890 Lymphedema, not elsewhere classified: Secondary | ICD-10-CM | POA: Diagnosis not present

## 2017-05-08 NOTE — Progress Notes (Signed)
Laura Nolan, Laura Nolan ([id number]) Visit Report for 05/06/2017 HPI Details Patient Name: Laura Nolan, Laura Nolan Date of Service: 05/06/2017 9:00 AM Medical Record Number: [id number] Patient Account Number: [id number] Date of Birth/Sex: 11-Jul-1970 (47 y.o. F) Treating RN: Cornell Barman Primary Care Provider: Salome Holmes Other Clinician: Referring Provider: Salome Holmes Treating Provider/Extender: Tito Dine in Treatment: 4 History of Present Illness HPI Description: 04/08/17; ADMISSION This is a 47 year old woman with morbid obesity and significant massive lymphedema. Particular area of interest is in the left b. extending into the hip where she has some massive outgrowth of soft tissue pannus with classic skin changes of lymphedema. She was hospitalized from 12/17/17 through 12/22/17 an extensive cellulitis. Initially treated with IV cefazolin and vancomycin and discharged on Keflex and clindamycin. An ultrasound of the area was done because I don't believe they could do a CT scan that did not show an abscess. 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 She also has a small draining area superiorly which constantly drains and soaks her clothing. She states she has some discomfort but no real pain similar to what she had when she had cellulitis in the hospital. She has been using topical antibiotics to the open areas. She noted that she had a temperature yesterday with chills her temperature is 99.6 today in clinic. She denies cough, dysuria, diarrhea The patient is not a diabetic. She has depression, elevated liver function tests, hypertension, hypothyroidism stage III chronic renal failure. She had a hemoglobin of 9.7 when she is in the hospital I don't see an exact reason for  that. 04/15/17 she is here for evaluation for a left inferior pannus ulcer (cluster 3) and drainage from her left gluteal/hip lymphedema. The transabdominal ultrasound performed on 3/18 revealed scattered subcutaneous edema at the site of clinical concern in the left buttock without discrete abscess collection or mass. She saw her primary care last Thursday who initiated doxycycline, she will be completing this on/about this weekend. The draining area to her left gluteal/hip lymphedema has completely resolved. She did receive products but was unsure of correct application. She states she has been applying what sounds like Silvadene. She has now been correctly advised will transition to our orders for silver alginate. She has made modifications to her work environment which provides appropriate offloading. She will follow-up next week 04/28/17; patient is here for continued follow-up of her left inferior pannus ulcer [cluster of 3] and drainage from her left gluteal hip lymphedema. She is not having any specific drainage. Of the 3 clustered open areas under the panus only 2 are now open and they are smaller. The large pannus itself has a pinkish discoloration however this is certainly not looking infected. Clearly lymphedema with a peau d'orange texture and appearance of the skin 05/06/17; patient is here for continued follow-up of a cluster of 3 wounds under the large lateral thigh and buttock tenderness. 2 are closed today. The pannus itself as lymphedema and peau d'orange texture however there is no overt cellulitis. Electronic Signature(s) Signed: 05/06/2017 5:02:04 PM By: Linton Ham MD Entered By: Linton Ham on 05/06/2017 09:51:57 Laura Nolan ([id number]) -------------------------------------------------------------------------------- Physical Exam Details Patient Name: Laura Nolan Date of Service: 05/06/2017 9:00 AM Medical Record Number: [id number] Patient Account Number:  [id number] Date of Birth/Sex: 01-01-1971 (47 y.o. F) Treating RN: Cornell Barman Primary Care Provider: Salome Holmes Other Clinician: Referring Provider: Salome Holmes Treating Provider/Extender: Tito Dine in Treatment: 4 Constitutional Patient is hypertensive.Marland Kitchen Respirations regular, non-labored and within target range.. Temperature is normal and within the target range for the patient.Marland Kitchen appears in no distress. Notes wound exam under the fold of the patient's large buttock lymphedema/pannus was original cluster of 3 wounds. I think to her that 3 of these are fully epithelialized only one is still open. No debridement is required. oThe large pannus itself continues to have a slightly pink color nor there is no tenderness. No other open areas is seen. Electronic Signature(s) Signed: 05/06/2017 5:02:04 PM By: Linton Ham MD Entered By: Linton Ham on 05/06/2017 09:53:14 Laura Nolan ([id number]) -------------------------------------------------------------------------------- Physician Orders Details Patient Name: Laura Nolan Date of Service: 05/06/2017 9:00 AM Medical Record Number: [id number] Patient Account Number: [id number] Date of Birth/Sex: 1970-07-04 (47 y.o. F) Treating RN: Cornell Barman Primary Care Provider: Salome Holmes Other Clinician: Referring Provider: Salome Holmes Treating Provider/Extender: Tito Dine in Treatment: 4 Verbal / Phone Orders: No Diagnosis Coding Wound Cleansing Wound #2 Left Gluteal fold o Cleanse wound with mild soap and water Anesthetic (add to Medication List) Wound #2 Left Gluteal fold o Topical Lidocaine 4% cream applied to wound bed prior to debridement (In Clinic Only). Primary Wound Dressing Wound #2 Left Gluteal fold o Silver Alginate - Left Gluteal Fold Secondary Dressing Wound #2 Left Gluteal fold o Boardered Foam Dressing Dressing Change Frequency Wound #2 Left Gluteal fold o Change dressing  every other day. Follow-up Appointments Wound #2 Left Gluteal fold o Return Appointment in 1 week. Off-Loading Wound #2 Left Gluteal fold o Other: - Cushion wounded area Engineer, maintenance) Signed: 05/06/2017 1:43:28 PM By: Gretta Cool, BSN, RN, CWS, Kim RN, BSN Signed: 05/06/2017 5:02:04 PM By: Linton Ham MD Entered By: Gretta Cool, BSN, RN, CWS, Kim on 05/06/2017 09:36:09 Laura Nolan ([id number]) -------------------------------------------------------------------------------- Problem List Details Patient Name: Laura Nolan Date of Service: 05/06/2017 9:00 AM Medical Record Number: [id number] Patient Account Number: [id number] Date of Birth/Sex: [birth year] (47 y.o. F) Treating RN: Cornell Barman Primary Care Provider: Salome Holmes Other Clinician: Referring Provider: Salome Holmes Treating Provider/Extender: Tito Dine in Treatment: 4 Active Problems ICD-10 Impacting Encounter Code Description Active Date Wound Healing Diagnosis L89.302 Pressure ulcer of unspecified buttock, stage 2 04/08/2017 Yes I89.0 Lymphedema, not elsewhere classified 04/08/2017 Yes Inactive Problems Resolved Problems Electronic Signature(s) Signed: 05/06/2017 5:02:04 PM By: Linton Ham MD Entered By: Linton Ham on 05/06/2017 09:50:34 Laura Nolan ([id number]) -------------------------------------------------------------------------------- Progress Note Details Patient Name: Laura Nolan Date of Service: 05/06/2017 9:00 AM Medical Record Number: [id number] Patient Account Number: [id number] Date of Birth/Sex: Feb 09, 1970 (47 y.o. F) Treating RN: Cornell Barman Primary Care Provider: Salome Holmes Other Clinician: Referring Provider: Salome Holmes Treating Provider/Extender: Tito Dine in Treatment: 4 Subjective History of Present Illness (HPI) 04/08/17; ADMISSION This is a 46 year old woman with morbid obesity and significant massive lymphedema. Particular area of interest is  in the left b. extending into the hip where she has some massive outgrowth of soft tissue pannus with classic skin changes of lymphedema. She was hospitalized from 12/17/17 through 12/22/17 an extensive cellulitis. Initially treated with IV cefazolin and vancomycin and discharged on Keflex and clindamycin. An ultrasound of the area was done because I don't believe they could do a CT scan that did not show an abscess. 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 She also has a small draining area superiorly which constantly drains and soaks her clothing. She states she has some discomfort but no real pain similar to what she had when she had cellulitis in the hospital. She has been using topical antibiotics to the open areas. She noted that she had a temperature yesterday with chills her temperature is 99.6 today in clinic. She denies cough, dysuria, diarrhea The patient is not a diabetic. She has depression, elevated liver function tests, hypertension, hypothyroidism stage III chronic renal failure. She had a hemoglobin of 9.7 when she is in the hospital I don't see an exact reason for that. 04/15/17 she is here for evaluation for a left inferior pannus ulcer (cluster 3) and drainage from her left gluteal/hip lymphedema. The transabdominal ultrasound performed on 3/18 revealed scattered subcutaneous edema at the site of clinical concern in the left buttock without discrete abscess collection or mass. She saw her primary care last Thursday who initiated doxycycline, she will be completing this on/about this weekend. The draining area to her left gluteal/hip lymphedema has completely resolved. She did receive products but was unsure of correct application. She states she has been  applying what sounds like Silvadene. She has now been correctly advised will transition to our orders for silver alginate. She has made modifications to her work environment which provides appropriate offloading. She will follow-up next week 04/28/17; patient is here for continued follow-up of her left inferior pannus ulcer [cluster of 3] and drainage from her left gluteal hip lymphedema. She is not having any specific drainage. Of the 3 clustered open areas under the panus only 2 are now open and they are smaller. The large pannus itself has a pinkish discoloration however this is certainly not looking infected. Clearly lymphedema with a peau d'orange texture and appearance of the skin 05/06/17; patient is here for continued follow-up of a cluster of 3 wounds under the large lateral thigh and buttock tenderness. 2 are closed today. The pannus itself as lymphedema and peau d'orange texture however there is no overt cellulitis. Objective Constitutional Patient is hypertensive.Marland Kitchen Respirations regular, non-labored and within target range.. Temperature is normal and within the target range for the patient.Marland Kitchen appears in no distress. Laura Nolan, Laura Nolan ([id number]) Vitals Time Taken: 9:14 AM, Height: 66 in, Weight: 500 lbs, BMI: 80.7, Temperature: 98.6 F, Pulse: 79 bpm, Respiratory Rate: 18 breaths/min, Blood Pressure: 146/80 mmHg. General Notes: wound exam under the fold of the patient's large buttock lymphedema/pannus was original cluster of 3 wounds. I think to her that 3 of these are fully epithelialized only one is still open. No debridement is required. The large pannus itself continues to have a slightly pink color nor there is no tenderness. No other open areas is seen. Integumentary (Hair, Skin) Wound #2 status is Open. Original cause of wound was Gradually Appeared. The wound is located on the Left Gluteal fold. The wound measures 0.6cm length x 0.5cm width x 0.1cm depth; 0.236cm^2 area and 0.024cm^3  volume. There is no tunneling or undermining noted. There is a medium amount of serosanguineous drainage noted. The wound margin is indistinct and nonvisible. There is large (67-100%) red granulation within the wound bed. There is no necrotic tissue within the wound bed. The periwound skin appearance did not exhibit: Callus, Crepitus, Excoriation, Induration, Rash,  Scarring, Dry/Scaly, Maceration, Atrophie Blanche, Cyanosis, Ecchymosis, Hemosiderin Staining, Mottled, Pallor, Rubor, Erythema. Periwound temperature was noted as No Abnormality. The periwound has tenderness on palpation. Assessment Active Problems ICD-10 L89.302 - Pressure ulcer of unspecified buttock, stage 2 I89.0 - Lymphedema, not elsewhere classified Plan Wound Cleansing: Wound #2 Left Gluteal fold: Cleanse wound with mild soap and water Anesthetic (add to Medication List): Wound #2 Left Gluteal fold: Topical Lidocaine 4% cream applied to wound bed prior to debridement (In Clinic Only). Primary Wound Dressing: Wound #2 Left Gluteal fold: Silver Alginate - Left Gluteal Fold Secondary Dressing: Wound #2 Left Gluteal fold: Boardered Foam Dressing Dressing Change Frequency: Wound #2 Left Gluteal fold: Change dressing every other day. Follow-up Appointments: Wound #2 Left Gluteal fold: Return Appointment in 1 week. Off-Loading: Wound #2 Left Gluteal fold: Other: - Cushion wounded area [place], Jocelyn Lamer ([id number]) #1continue with silver alginate and border foam dressings #2 this should be closed by next week #3 secondary prevention will be offloading and careful attention the condition of her skin Electronic Signature(s) Signed: 05/06/2017 5:02:04 PM By: Linton Ham MD Entered By: Linton Ham on 05/06/2017 09:55:05 Laura Nolan ([id number]) -------------------------------------------------------------------------------- SuperBill Details Patient Name: Laura Nolan Date of Service: 05/06/2017 Medical Record  Number: [id number] Patient Account Number: [id number] Date of Birth/Sex: Feb 11, 1970 (47 y.o. F) Treating RN: Cornell Barman Primary Care Provider: Salome Holmes Other Clinician: Referring Provider: Salome Holmes Treating Provider/Extender: Tito Dine in Treatment: 4 Diagnosis Coding ICD-10 Codes Code Description L89.302 Pressure ulcer of unspecified buttock, stage 2 I89.0 Lymphedema, not elsewhere classified Facility Procedures CPT4 Code: [id number] Description: [phone] - WOUND CARE VISIT-LEV 2 EST PT Modifier: Quantity: 1 Physician Procedures CPT4 Code: [id number] Description: [id number] - WC PHYS LEVEL 2 - EST PT ICD-10 Diagnosis Description L89.302 Pressure ulcer of unspecified buttock, stage 2 Modifier: Quantity: 1 Electronic Signature(s) Signed: 05/06/2017 5:02:04 PM By: Linton Ham MD Entered By: Linton Ham on 05/06/2017 09:55:28

## 2017-05-08 NOTE — Progress Notes (Signed)
ANNABELLE, REXROAD ([id number]) Visit Report for 05/06/2017 Arrival Information Details Patient Name: SAFIYYAH, VASCONEZ Date of Service: 05/06/2017 9:00 AM Medical Record Number: [id number] Patient Account Number: [id number] Date of Birth/Sex: Jun 06, 1970 (47 y.o. F) Treating RN: Montey Hora Primary Care Issacc Merlo: Salome Holmes Other Clinician: Referring Janda Cargo: Salome Holmes Treating Marli Diego/Extender: Tito Dine in Treatment: 4 Visit Information History Since Last Visit Added or deleted any medications: No Patient Arrived: Cane Any new allergies or adverse reactions: No Arrival Time: 09:14 Had a fall or experienced change in No Accompanied By: self activities of daily living that may affect Transfer Assistance: None risk of falls: Patient Identification Verified: Yes Signs or symptoms of abuse/neglect since last visito No Secondary Verification Process Completed: Yes Hospitalized since last visit: No Implantable device outside of the clinic excluding No cellular tissue based products placed in the center since last visit: Has Dressing in Place as Prescribed: Yes Pain Present Now: No Electronic Signature(s) Signed: 05/06/2017 5:03:40 PM By: Montey Hora Entered By: Montey Hora on 05/06/2017 09:14:27 [name] ([id number]) -------------------------------------------------------------------------------- Clinic Level of Care Assessment Details Patient Name: [name] Date of Service: 05/06/2017 9:00 AM Medical Record Number: [id number] Patient Account Number: [id number] Date of Birth/Sex: 1970-05-27 (47 y.o. F) Treating RN: Cornell Barman Primary Care Shalev Helminiak: Salome Holmes Other Clinician: Referring Meagen Limones: Salome Holmes Treating Baltazar Pekala/Extender: Tito Dine in Treatment: 4 Clinic Level of Care Assessment Items TOOL 4 Quantity Score []  - Use when only an EandM is performed on FOLLOW-UP visit 0 ASSESSMENTS - Nursing Assessment / Reassessment []   - Reassessment of Co-morbidities (includes updates in patient status) 0 X- 1 5 Reassessment of Adherence to Treatment Plan ASSESSMENTS - Wound and Skin Assessment / Reassessment X - Simple Wound Assessment / Reassessment - one wound 1 5 []  - 0 Complex Wound Assessment / Reassessment - multiple wounds []  - 0 Dermatologic / Skin Assessment (not related to wound area) ASSESSMENTS - Focused Assessment []  - Circumferential Edema Measurements - multi extremities 0 []  - 0 Nutritional Assessment / Counseling / Intervention []  - 0 Lower Extremity Assessment (monofilament, tuning fork, pulses) []  - 0 Peripheral Arterial Disease Assessment (using hand held doppler) ASSESSMENTS - Ostomy and/or Continence Assessment and Care []  - Incontinence Assessment and Management 0 []  - 0 Ostomy Care Assessment and Management (repouching, etc.) PROCESS - Coordination of Care X - Simple Patient / Family Education for ongoing care 1 15 []  - 0 Complex (extensive) Patient / Family Education for ongoing care []  - 0 Staff obtains Programmer, systems, Records, Test Results / Process Orders []  - 0 Staff telephones HHA, Nursing Homes / Clarify orders / etc []  - 0 Routine Transfer to another Facility (non-emergent condition) []  - 0 Routine Hospital Admission (non-emergent condition) []  - 0 New Admissions / Biomedical engineer / Ordering NPWT, Apligraf, etc. []  - 0 Emergency Hospital Admission (emergent condition) X- 1 10 Simple Discharge Coordination MURL, ZOGG ([id number]) []  - 0 Complex (extensive) Discharge Coordination PROCESS - Special Needs []  - Pediatric / Minor Patient Management 0 []  - 0 Isolation Patient Management []  - 0 Hearing / Language / Visual special needs []  - 0 Assessment of Community assistance (transportation, D/C planning, etc.) []  - 0 Additional assistance / Altered mentation []  - 0 Support Surface(s) Assessment (bed, cushion, seat, etc.) INTERVENTIONS - Wound Cleansing /  Measurement X - Simple Wound Cleansing - one wound 1 5 []  - 0 Complex Wound Cleansing - multiple wounds X- 1 5 Wound Imaging (photographs - any number of wounds) []  -  0 Wound Tracing (instead of photographs) X- 1 5 Simple Wound Measurement - one wound []  - 0 Complex Wound Measurement - multiple wounds INTERVENTIONS - Wound Dressings []  - Small Wound Dressing one or multiple wounds 0 X- 1 15 Medium Wound Dressing one or multiple wounds []  - 0 Large Wound Dressing one or multiple wounds []  - 0 Application of Medications - topical []  - 0 Application of Medications - injection INTERVENTIONS - Miscellaneous []  - External ear exam 0 []  - 0 Specimen Collection (cultures, biopsies, blood, body fluids, etc.) []  - 0 Specimen(s) / Culture(s) sent or taken to Lab for analysis []  - 0 Patient Transfer (multiple staff / Civil Service fast streamer / Similar devices) []  - 0 Simple Staple / Suture removal (25 or less) []  - 0 Complex Staple / Suture removal (26 or more) []  - 0 Hypo / Hyperglycemic Management (close monitor of Blood Glucose) []  - 0 Ankle / Brachial Index (ABI) - do not check if billed separately X- 1 5 Vital Signs Bussa, Othella ([id number]) Has the patient been seen at the hospital within the last three years: Yes Total Score: 70 Level Of Care: New/Established - Level 2 Electronic Signature(s) Signed: 05/06/2017 1:43:28 PM By: Gretta Cool, BSN, RN, CWS, Kim RN, BSN Entered By: Gretta Cool, BSN, RN, CWS, Kim on 05/06/2017 09:36:30 [name] ([id number]) -------------------------------------------------------------------------------- Encounter Discharge Information Details Patient Name: [name] Date of Service: 05/06/2017 9:00 AM Medical Record Number: [id number] Patient Account Number: [id number] Date of Birth/Sex: 04-Oct-1970 (47 y.o. F) Treating RN: Cornell Barman Primary Care Alyah Boehning: Salome Holmes Other Clinician: Referring Rusti Arizmendi: Salome Holmes Treating Aleyza Salmi/Extender: Tito Dine in Treatment: 4 Encounter Discharge Information Items Discharge Pain Level: 0 Discharge Condition: Stable Ambulatory Status: Cane Discharge Destination: Home Transportation: Private Auto Accompanied By: self Schedule Follow-up Appointment: Yes Medication Reconciliation completed and No provided to Patient/Care Deondra Wigger: Provided on Clinical Summary of Care: 05/06/2017 Form Type Recipient Paper Patient VD Electronic Signature(s) Signed: 05/06/2017 1:43:28 PM By: Gretta Cool, BSN, RN, CWS, Kim RN, BSN Entered By: Gretta Cool, BSN, RN, CWS, Kim on 05/06/2017 09:37:28 [name] ([id number]) -------------------------------------------------------------------------------- Lower Extremity Assessment Details Patient Name: [name] Date of Service: 05/06/2017 9:00 AM Medical Record Number: [id number] Patient Account Number: [id number] Date of Birth/Sex: [birth year] (47 y.o. F) Treating RN: Montey Hora Primary Care Vernica Wachtel: Salome Holmes Other Clinician: Referring Aldine Grainger: Salome Holmes Treating Renarda Mullinix/Extender: Tito Dine in Treatment: 4 Electronic Signature(s) Signed: 05/06/2017 5:03:40 PM By: Montey Hora Entered By: Montey Hora on 05/06/2017 09:18:27 [name] ([id number]) -------------------------------------------------------------------------------- Multi Wound Chart Details Patient Name: [name] Date of Service: 05/06/2017 9:00 AM Medical Record Number: [id number] Patient Account Number: [id number] Date of Birth/Sex: 08-27-1970 (47 y.o. F) Treating RN: Cornell Barman Primary Care Annalyssa Thune: Salome Holmes Other Clinician: Referring Kevina Piloto: Salome Holmes Treating Dejean Tribby/Extender: Tito Dine in Treatment: 4 Vital Signs Height(in): 66 Pulse(bpm): 73 Weight(lbs): 500 Blood Pressure(mmHg): 146/80 Body Mass Index(BMI): 81 Temperature(F): 98.6 Respiratory Rate 18 (breaths/min): Photos: [2:No Photos] [N/A:N/A] Wound  Location: [2:Left Gluteal fold] [N/A:N/A] Wounding Event: [2:Gradually Appeared] [N/A:N/A] Primary Etiology: [2:Pressure Ulcer] [N/A:N/A] Comorbid History: [2:Lymphedema, Asthma, Hypertension, Received Chemotherapy] [N/A:N/A] Date Acquired: [2:12/18/2016] [N/A:N/A] Weeks of Treatment: [2:4] [N/A:N/A] Wound Status: [2:Open] [N/A:N/A] Clustered Wound: [2:Yes] [N/A:N/A] Clustered Quantity: [2:3] [N/A:N/A] Measurements L x W x D [2:0.6x0.5x0.1] [N/A:N/A] (cm) Area (cm) : [2:0.236] [N/A:N/A] Volume (cm) : [2:0.024] [N/A:N/A] % Reduction in Area: [2:93.30%] [N/A:N/A] % Reduction in Volume: [2:93.20%] [N/A:N/A] Classification: [2:Category/Stage III] [N/A:N/A] Exudate Amount: [2:Medium] [N/A:N/A] Exudate Type: [2:Serosanguineous] [N/A:N/A]  Exudate Color: [2:red, brown] [N/A:N/A] Wound Margin: [2:Indistinct, nonvisible] [N/A:N/A] Granulation Amount: [2:Large (67-100%)] [N/A:N/A] Granulation Quality: [2:Red] [N/A:N/A] Necrotic Amount: [2:None Present (0%)] [N/A:N/A] Exposed Structures: [2:Fascia: No Fat Layer (Subcutaneous Tissue) Exposed: No Tendon: No Muscle: No Joint: No Bone: No] [N/A:N/A] Epithelialization: [2:Small (1-33%)] [N/A:N/A] Periwound Skin Texture: [2:Excoriation: No Induration: No Callus: No] [N/A:N/A] Crepitus: No Rash: No Scarring: No Periwound Skin Moisture: Maceration: No N/A N/A Dry/Scaly: No Periwound Skin Color: Atrophie Blanche: No N/A N/A Cyanosis: No Ecchymosis: No Erythema: No Hemosiderin Staining: No Mottled: No Pallor: No Rubor: No Temperature: No Abnormality N/A N/A Tenderness on Palpation: Yes N/A N/A Wound Preparation: Ulcer Cleansing: N/A N/A Rinsed/Irrigated with Saline Topical Anesthetic Applied: None Treatment Notes Wound #2 (Left Gluteal fold) 1. Cleansed with: Clean wound with Normal Saline 2. Anesthetic Topical Lidocaine 4% cream to wound bed prior to debridement 4. Dressing Applied: Aquacel Ag 5. Secondary Dressing  Applied Bordered Foam Dressing Notes silvercel Electronic Signature(s) Signed: 05/06/2017 5:02:04 PM By: Linton Ham MD Entered By: Linton Ham on 05/06/2017 09:50:48 [name] ([id number]) -------------------------------------------------------------------------------- Multi-Disciplinary Care Plan Details Patient Name: [name] Date of Service: 05/06/2017 9:00 AM Medical Record Number: [id number] Patient Account Number: [id number] Date of Birth/Sex: [birth year] (47 y.o. F) Treating RN: Cornell Barman Primary Care Masato Pettie: Salome Holmes Other Clinician: Referring Jailine Lieder: Salome Holmes Treating Cassadee Vanzandt/Extender: Tito Dine in Treatment: 4 Active Inactive ` Abuse / Safety / Falls / Self Care Management Nursing Diagnoses: Impaired physical mobility Goals: Patient will remain injury free related to falls Date Initiated: 04/08/2017 Target Resolution Date: 05/09/2017 Goal Status: Active Patient/caregiver will verbalize/demonstrate measures taken to prevent injury and/or falls Date Initiated: 04/08/2017 Target Resolution Date: 05/09/2017 Goal Status: Active Interventions: Podiatry chair, stretcher in low position and side rails up as needed Notes: ` Orientation to the Wound Care Program Nursing Diagnoses: Knowledge deficit related to the wound healing center program Goals: Patient/caregiver will verbalize understanding of the [facility] Date Initiated: 04/08/2017 Target Resolution Date: 05/09/2017 Goal Status: Active Interventions: Provide education on orientation to the wound center Notes: ` Pressure Nursing Diagnoses: Knowledge deficit related to management of pressures ulcers Goals: Patient will remain free from development of additional pressure ulcers Date Initiated: 04/08/2017 Target Resolution Date: 05/15/2017 AMUNIQUE, NEYRA ([id number]) Goal Status: Active Interventions: Assess: immobility, friction, shearing, incontinence  upon admission and as needed Notes: ` Wound/Skin Impairment Nursing Diagnoses: Impaired tissue integrity Knowledge deficit related to smoking impact on wound healing Goals: Ulcer/skin breakdown will heal within 14 weeks Date Initiated: 04/08/2017 Target Resolution Date: 07/26/2017 Goal Status: Active Interventions: Assess ulceration(s) every visit Treatment Activities: Skin care regimen initiated : 04/08/2017 Notes: Electronic Signature(s) Signed: 05/06/2017 1:43:28 PM By: Gretta Cool, BSN, RN, CWS, Kim RN, BSN Entered By: Gretta Cool, BSN, RN, CWS, Kim on 05/06/2017 09:33:23 [name] ([id number]) -------------------------------------------------------------------------------- Pain Assessment Details Patient Name: [name] Date of Service: 05/06/2017 9:00 AM Medical Record Number: [id number] Patient Account Number: [id number] Date of Birth/Sex: 1970-06-27 (47 y.o. F) Treating RN: Montey Hora Primary Care Abbi Mancini: Salome Holmes Other Clinician: Referring Trelyn Vanderlinde: Salome Holmes Treating Danelle Curiale/Extender: Tito Dine in Treatment: 4 Active Problems Location of Pain Severity and Description of Pain Patient Has Paino No Site Locations Pain Management and Medication Current Pain Management: Electronic Signature(s) Signed: 05/06/2017 5:03:40 PM By: Montey Hora Entered By: Montey Hora on 05/06/2017 09:14:34 [name] ([id number]) -------------------------------------------------------------------------------- Wound Assessment Details Patient Name: [name] Date of Service: 05/06/2017 9:00 AM Medical Record Number: [id number] Patient Account Number: [id number] Date of Birth/Sex:  1970-12-06 (47 y.o. F) Treating RN: Montey Hora Primary Care Haydan Wedig: Salome Holmes Other Clinician: Referring Dylann Layne: Salome Holmes Treating Reyaan Thoma/Extender: Tito Dine in Treatment: 4 Wound Status Wound Number: 2 Primary Pressure Ulcer Etiology: Wound  Location: Left Gluteal fold Wound Status: Open Wounding Event: Gradually Appeared Comorbid Lymphedema, Asthma, Hypertension, Date Acquired: 12/18/2016 History: Received Chemotherapy Weeks Of Treatment: 4 Clustered Wound: Yes Photos Photo Uploaded By: Montey Hora on 05/06/2017 11:53:14 Wound Measurements Length: (cm) 0.6 % Redu Width: (cm) 0.5 % Redu Depth: (cm) 0.1 Epithe Clustered Quantity: 3 Tunnel Area: (cm) 0.236 Under Volume: (cm) 0.024 ction in Area: 93.3% ction in Volume: 93.2% lialization: Small (1-33%) ing: No mining: No Wound Description Classification: Category/Stage III Wound Margin: Indistinct, nonvisible Exudate Amount: Medium Exudate Type: Serosanguineous Exudate Color: red, brown Foul Odor After Cleansing: No Slough/Fibrino Yes Wound Bed Granulation Amount: Large (67-100%) Exposed Structure Granulation Quality: Red Fascia Exposed: No Necrotic Amount: None Present (0%) Fat Layer (Subcutaneous Tissue) Exposed: No Tendon Exposed: No Muscle Exposed: No Joint Exposed: No Bone Exposed: No Periwound Skin Texture Willmann, Mintie ([id number]) Texture Color No Abnormalities Noted: No No Abnormalities Noted: No Callus: No Atrophie Blanche: No Crepitus: No Cyanosis: No Excoriation: No Ecchymosis: No Induration: No Erythema: No Rash: No Hemosiderin Staining: No Scarring: No Mottled: No Pallor: No Moisture Rubor: No No Abnormalities Noted: No Dry / Scaly: No Temperature / Pain Maceration: No Temperature: No Abnormality Tenderness on Palpation: Yes Wound Preparation Ulcer Cleansing: Rinsed/Irrigated with Saline Topical Anesthetic Applied: None Treatment Notes Wound #2 (Left Gluteal fold) 1. Cleansed with: Clean wound with Normal Saline 2. Anesthetic Topical Lidocaine 4% cream to wound bed prior to debridement 4. Dressing Applied: Aquacel Ag 5. Secondary Dressing Applied Bordered Foam Dressing Notes silvercel Electronic  Signature(s) Signed: 05/06/2017 5:03:40 PM By: Montey Hora Entered By: Montey Hora on 05/06/2017 09:18:17 [name] ([id number]) -------------------------------------------------------------------------------- Vitals Details Patient Name: [name] Date of Service: 05/06/2017 9:00 AM Medical Record Number: [id number] Patient Account Number: [id number] Date of Birth/Sex: 02/05/1970 (47 y.o. F) Treating RN: Montey Hora Primary Care Sundy Houchins: Salome Holmes Other Clinician: Referring Anasia Agro: Salome Holmes Treating Javel Hersh/Extender: Tito Dine in Treatment: 4 Vital Signs Time Taken: 09:14 Temperature (F): 98.6 Height (in): 66 Pulse (bpm): 79 Weight (lbs): 500 Respiratory Rate (breaths/min): 18 Body Mass Index (BMI): 80.7 Blood Pressure (mmHg): 146/80 Reference Range: 80 - 120 mg / dl Electronic Signature(s) Signed: 05/06/2017 5:03:40 PM By: Montey Hora Entered By: Montey Hora on 05/06/2017 09:14:58

## 2017-05-13 ENCOUNTER — Encounter: Payer: 59 | Admitting: Internal Medicine

## 2017-05-13 DIAGNOSIS — I890 Lymphedema, not elsewhere classified: Secondary | ICD-10-CM | POA: Diagnosis not present

## 2017-05-18 NOTE — Progress Notes (Signed)
Laura Nolan ([id number]) Visit Report for 05/13/2017 HPI Details Patient Name: Laura Nolan Date of Service: 05/13/2017 9:00 AM Medical Record Number: [id number] Patient Account Number: [id number] Date of Birth/Sex: 1970-04-24 (47 y.o. F) Treating RN: Cornell Barman Primary Care Provider: Salome Holmes Other Clinician: Referring Provider: Salome Holmes Treating Provider/Extender: Tito Dine in Treatment: 5 History of Present Illness HPI Description: 04/08/17; ADMISSION This is a 47 year old woman with morbid obesity and significant massive lymphedema. Particular area of interest is in the left b. extending into the hip where she has some massive outgrowth of soft tissue pannus with classic skin changes of lymphedema. She was hospitalized from 12/17/17 through 12/22/17 an extensive cellulitis. Initially treated with IV cefazolin and vancomycin and discharged on Keflex and clindamycin. An ultrasound of the area was done because I don't believe they could do a CT scan that did not show an abscess. 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 She also has a small draining area superiorly which constantly drains and soaks her clothing. She states she has some discomfort but no real pain similar to what she had when she had cellulitis in the hospital. She has been using topical antibiotics to the open areas. She noted that she had a temperature yesterday with chills her temperature is 99.6 today in clinic. She denies cough, dysuria, diarrhea The patient is not a diabetic. She has depression, elevated liver function tests, hypertension, hypothyroidism stage III chronic renal failure. She had a hemoglobin of 9.7 when she is in the hospital I don't see an exact reason for  that. 04/15/17 she is here for evaluation for a left inferior pannus ulcer (cluster 3) and drainage from her left gluteal/hip lymphedema. The transabdominal ultrasound performed on 3/18 revealed scattered subcutaneous edema at the site of clinical concern in the left buttock without discrete abscess collection or mass. She saw her primary care last Thursday who initiated doxycycline, she will be completing this on/about this weekend. The draining area to her left gluteal/hip lymphedema has completely resolved. She did receive products but was unsure of correct application. She states she has been applying what sounds like Silvadene. She has now been correctly advised will transition to our orders for silver alginate. She has made modifications to her work environment which provides appropriate offloading. She will follow-up next week 04/28/17; patient is here for continued follow-up of her left inferior pannus ulcer [cluster of 3] and drainage from her left gluteal hip lymphedema. She is not having any specific drainage. Of the 3 clustered open areas under the panus only 2 are now open and they are smaller. The large pannus itself has a pinkish discoloration however this is certainly not looking infected. Clearly lymphedema with a peau d'orange texture and appearance of the skin 05/06/17; patient is here for continued follow-up of a cluster of 3 wounds under the large lateral thigh and buttock tenderness. 2 are closed today. The pannus itself as lymphedema and peau d'orange texture however there is no overt cellulitis. 05/13/17; all of the areas under the large lymphedematous pannus on the left which are pressure ulcers have healed. She reports some weeping today from the lymphedema however there is no open area today. The tissue has peau d'orange type skin. There is a pinkish hue to the  skin color but there is certainly no evidence of infection. She is taking all offloading measures that she can.  Careful to monitor the larger area of lymphedema over the left lateral hip and buttock area. She is washing this was some form of antibacterial substance question Hibiclens Electronic Signature(s) Signed: 05/13/2017 5:09:03 PM By: Linton Ham MD Entered By: Linton Ham on 05/13/2017 09:21:25 Laura Nolan ([id number]) -------------------------------------------------------------------------------- Physical Exam Details Patient Name: Laura Nolan Date of Service: 05/13/2017 9:00 AM Medical Record Number: [id number] Patient Account Number: [id number] Date of Birth/Sex: [birth year] (47 y.o. F) Treating RN: Cornell Barman Primary Care Provider: Salome Holmes Other Clinician: Referring Provider: Salome Holmes Treating Provider/Extender: Tito Dine in Treatment: 5 Constitutional Sitting or standing Blood Pressure is within target range for patient.. Pulse regular and within target range for patient.Marland Kitchen Respirations regular, non-labored and within target range.. Temperature is normal and within the target range for the patient.Marland Kitchen appears in no distress. Notes wound exam; under the patient's large buttock lymphedematous/pannus the original 3 wounds are all fully epithelialized. The pannus itself has peau d'orange type skin which has a pinkish discoloration however there is no tenderness here nothing that looks infected. Electronic Signature(s) Signed: 05/13/2017 5:09:03 PM By: Linton Ham MD Entered By: Linton Ham on 05/13/2017 09:22:36 Laura Nolan ([id number]) -------------------------------------------------------------------------------- Physician Orders Details Patient Name: Laura Nolan Date of Service: 05/13/2017 9:00 AM Medical Record Number: [id number] Patient Account Number: [id number] Date of Birth/Sex: 1970/09/26 (47 y.o. F) Treating RN: Cornell Barman Primary Care Provider: Salome Holmes Other Clinician: Referring Provider: Salome Holmes Treating  Provider/Extender: Tito Dine in Treatment: 5 Verbal / Phone Orders: No Diagnosis Coding Discharge From [facility] Services Wound #2 Left Gluteal fold o Discharge from [facility] - treatment complete Electronic Signature(s) Signed: 05/13/2017 5:09:03 PM By: Linton Ham MD Signed: 05/13/2017 5:11:52 PM By: Gretta Cool, BSN, RN, CWS, Kim RN, BSN Entered By: Gretta Cool, BSN, RN, CWS, Kim on 05/13/2017 09:16:55 Laura Nolan ([id number]) -------------------------------------------------------------------------------- Problem List Details Patient Name: Laura Nolan Date of Service: 05/13/2017 9:00 AM Medical Record Number: [id number] Patient Account Number: [id number] Date of Birth/Sex: [birth year] (47 y.o. F) Treating RN: Cornell Barman Primary Care Provider: Salome Holmes Other Clinician: Referring Provider: Salome Holmes Treating Provider/Extender: Tito Dine in Treatment: 5 Active Problems ICD-10 Impacting Encounter Code Description Active Date Wound Healing Diagnosis L89.302 Pressure ulcer of unspecified buttock, stage 2 04/08/2017 Yes I89.0 Lymphedema, not elsewhere classified 04/08/2017 Yes Inactive Problems Resolved Problems Electronic Signature(s) Signed: 05/13/2017 5:09:03 PM By: Linton Ham MD Entered By: Linton Ham on 05/13/2017 09:19:37 Laura Nolan ([id number]) -------------------------------------------------------------------------------- Progress Note Details Patient Name: Laura Nolan Date of Service: 05/13/2017 9:00 AM Medical Record Number: [id number] Patient Account Number: [id number] Date of Birth/Sex: [birth year] (47 y.o. F) Treating RN: Cornell Barman Primary Care Provider: Salome Holmes Other Clinician: Referring Provider: Salome Holmes Treating Provider/Extender: Tito Dine in Treatment: 5 Subjective History of Present Illness (HPI) 04/08/17; ADMISSION This is a 47 year old woman with morbid obesity and significant massive  lymphedema. Particular area of interest is in the left b. extending into the hip where she has some massive outgrowth of soft tissue pannus with classic skin changes of lymphedema. She was hospitalized from 12/17/17 through 12/22/17 an extensive cellulitis. Initially treated with IV cefazolin and vancomycin and discharged on Keflex and clindamycin. An ultrasound of the area was done because I don't believe they could do a CT scan that did not show an abscess. 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 She also has a small draining area superiorly which constantly drains and soaks her clothing. She states she has some discomfort but no real pain similar to what she had when she had cellulitis in the hospital. She has been using topical antibiotics to the open areas. She noted that she had a temperature yesterday with chills her temperature is 99.6 today in clinic. She denies cough, dysuria, diarrhea The patient is not a diabetic. She has depression, elevated liver function tests, hypertension, hypothyroidism stage III chronic renal failure. She had a hemoglobin of 9.7 when she is in the hospital I don't see an exact reason for that. 04/15/17 she is here for evaluation for a left inferior pannus ulcer (cluster 3) and drainage from her left gluteal/hip lymphedema. The transabdominal ultrasound performed on 3/18 revealed scattered subcutaneous edema at the site of clinical concern in the left buttock without discrete abscess collection or mass. She saw her primary care last Thursday who initiated doxycycline, she will be completing this on/about this weekend. The draining area to her left gluteal/hip lymphedema has completely resolved. She did receive products but was unsure of correct  application. She states she has been applying what sounds like Silvadene. She has now been correctly advised will transition to our orders for silver alginate. She has made modifications to her work environment which provides appropriate offloading. She will follow-up next week 04/28/17; patient is here for continued follow-up of her left inferior pannus ulcer [cluster of 3] and drainage from her left gluteal hip lymphedema. She is not having any specific drainage. Of the 3 clustered open areas under the panus only 2 are now open and they are smaller. The large pannus itself has a pinkish discoloration however this is certainly not looking infected. Clearly lymphedema with a peau d'orange texture and appearance of the skin 05/06/17; patient is here for continued follow-up of a cluster of 3 wounds under the large lateral thigh and buttock tenderness. 2 are closed today. The pannus itself as lymphedema and peau d'orange texture however there is no overt cellulitis. 05/13/17; all of the areas under the large lymphedematous pannus on the left which are pressure ulcers have healed. She reports some weeping today from the lymphedema however there is no open area today. The tissue has peau d'orange type skin. There is a pinkish hue to the skin color but there is certainly no evidence of infection. She is taking all offloading measures that she can. Careful to monitor the larger area of lymphedema over the left lateral hip and buttock area. She is washing this was some form of antibacterial substance question Hibiclens Objective Laura Nolan, Laura Nolan ([id number]) Constitutional Sitting or standing Blood Pressure is within target range for patient.. Pulse regular and within target range for patient.Marland Kitchen Respirations regular, non-labored and within target range.. Temperature is normal and within the target range for the patient.Marland Kitchen appears in no distress. Vitals Time Taken: 9:07 AM, Height: 66 in, Weight: 500 lbs, BMI: 80.7,  Temperature: 98.7 F, Pulse: 82 bpm, Respiratory Rate: 18 breaths/min, Blood Pressure: 135/82 mmHg. General Notes: wound exam; under the patient's large buttock lymphedematous/pannus the original 3 wounds are all fully epithelialized. The pannus itself has peau d'orange type skin which has a pinkish discoloration however there is no tenderness here nothing that looks  infected. Integumentary (Hair, Skin) Wound #2 status is Healed - Epithelialized. Original cause of wound was Gradually Appeared. The wound is located on the Left Gluteal fold. The wound measures 0cm length x 0cm width x 0cm depth; 0cm^2 area and 0cm^3 volume. There is no tunneling or undermining noted. There is a none present amount of drainage noted. The wound margin is indistinct and nonvisible. There is large (67-100%) red granulation within the wound bed. There is no necrotic tissue within the wound bed. The periwound skin appearance did not exhibit: Callus, Crepitus, Excoriation, Induration, Rash, Scarring, Dry/Scaly, Maceration, Atrophie Blanche, Cyanosis, Ecchymosis, Hemosiderin Staining, Mottled, Pallor, Rubor, Erythema. Periwound temperature was noted as No Abnormality. The periwound has tenderness on palpation. Assessment Active Problems ICD-10 L89.302 - Pressure ulcer of unspecified buttock, stage 2 I89.0 - Lymphedema, not elsewhere classified Plan Discharge From [facility] Services: Wound #2 Left Gluteal fold: Discharge from [facility] - treatment complete #1 the patient can be discharged from the wound care center #2 I cautioned her about the possibility of recurrent cellulitis in this area. By my estimation she already had this twice #3 of course pressure relief is important and she is aware of this as well Electronic Signature(s) Laura Nolan, Laura Nolan ([id number]) Signed: 05/13/2017 5:09:03 PM By: Linton Ham MD Entered By: Linton Ham on 05/13/2017 09:23:14 Laura Nolan  ([id number]) -------------------------------------------------------------------------------- SuperBill Details Patient Name: Laura Nolan Date of Service: 05/13/2017 Medical Record Number: [id number] Patient Account Number: [id number] Date of Birth/Sex: Oct 05, 1970 (47 y.o. F) Treating RN: Cornell Barman Primary Care Provider: Salome Holmes Other Clinician: Referring Provider: Salome Holmes Treating Provider/Extender: Tito Dine in Treatment: 5 Diagnosis Coding ICD-10 Codes Code Description L89.302 Pressure ulcer of unspecified buttock, stage 2 I89.0 Lymphedema, not elsewhere classified Facility Procedures CPT4 Code: [id number] Description: [phone] - WOUND CARE VISIT-LEV 2 EST PT Modifier: Quantity: 1 Physician Procedures CPT4 Code: [id number] Description: 03704 - WC PHYS LEVEL 2 - EST PT ICD-10 Diagnosis Description L89.302 Pressure ulcer of unspecified buttock, stage 2 I89.0 Lymphedema, not elsewhere classified Modifier: Quantity: 1 Electronic Signature(s) Signed: 05/13/2017 5:09:03 PM By: Linton Ham MD Entered By: Linton Ham on 05/13/2017 09:23:33

## 2017-05-20 NOTE — Progress Notes (Signed)
MIEL, WISENER ([id number]) Visit Report for 05/13/2017 Arrival Information Details Patient Name: Laura Nolan, Laura Nolan Date of Service: 05/13/2017 9:00 AM Medical Record Number: [id number] Patient Account Number: [id number] Date of Birth/Sex: 12/17/1970 (47 y.o. F) Treating RN: Ahmed Prima Primary Care Lorelai Huyser: Salome Holmes Other Clinician: Referring Christinna Sprung: Salome Holmes Treating Felita Bump/Extender: Tito Dine in Treatment: 5 Visit Information History Since Last Visit All ordered tests and consults were completed: No Patient Arrived: [name] Added or deleted any medications: No Arrival Time: 09:07 Any new allergies or adverse reactions: No Accompanied By: self Had a fall or experienced change in No Transfer Assistance: None activities of daily living that may affect Patient Identification Verified: Yes risk of falls: Secondary Verification Process Completed: Yes Signs or symptoms of abuse/neglect since last visito No Patient Requires Transmission-Based Precautions: No Hospitalized since last visit: No Patient Has Alerts: No Implantable device outside of the clinic excluding No cellular tissue based products placed in the center since last visit: Has Dressing in Place as Prescribed: Yes Pain Present Now: No Electronic Signature(s) Signed: 05/14/2017 5:13:37 PM By: Alric Quan Entered By: Alric Quan on 05/13/2017 09:07:43 Laura Nolan ([id number]) -------------------------------------------------------------------------------- Clinic Level of Care Assessment Details Patient Name: Laura Nolan Date of Service: 05/13/2017 9:00 AM Medical Record Number: [id number] Patient Account Number: [id number] Date of Birth/Sex: 18-Jul-1970 (47 y.o. F) Treating RN: Cornell Barman Primary Care Tannia Contino: Salome Holmes Other Clinician: Referring Murray Durrell: Salome Holmes Treating Keia Rask/Extender: Tito Dine in Treatment: 5 Clinic Level of Care Assessment Items TOOL  4 Quantity Score []  - Use when only an EandM is performed on FOLLOW-UP visit 0 ASSESSMENTS - Nursing Assessment / Reassessment []  - Reassessment of Co-morbidities (includes updates in patient status) 0 X- 1 5 Reassessment of Adherence to Treatment Plan ASSESSMENTS - Wound and Skin Assessment / Reassessment X - Simple Wound Assessment / Reassessment - one wound 1 5 []  - 0 Complex Wound Assessment / Reassessment - multiple wounds []  - 0 Dermatologic / Skin Assessment (not related to wound area) ASSESSMENTS - Focused Assessment []  - Circumferential Edema Measurements - multi extremities 0 []  - 0 Nutritional Assessment / Counseling / Intervention []  - 0 Lower Extremity Assessment (monofilament, tuning fork, pulses) []  - 0 Peripheral Arterial Disease Assessment (using hand held doppler) ASSESSMENTS - Ostomy and/or Continence Assessment and Care []  - Incontinence Assessment and Management 0 []  - 0 Ostomy Care Assessment and Management (repouching, etc.) PROCESS - Coordination of Care X - Simple Patient / Family Education for ongoing care 1 15 []  - 0 Complex (extensive) Patient / Family Education for ongoing care []  - 0 Staff obtains Programmer, systems, Records, Test Results / Process Orders []  - 0 Staff telephones HHA, Nursing Homes / Clarify orders / etc []  - 0 Routine Transfer to another Facility (non-emergent condition) []  - 0 Routine Hospital Admission (non-emergent condition) []  - 0 New Admissions / Biomedical engineer / Ordering NPWT, Apligraf, etc. []  - 0 Emergency Hospital Admission (emergent condition) X- 1 10 Simple Discharge Coordination DAWNITA, MOLNER ([id number]) []  - 0 Complex (extensive) Discharge Coordination PROCESS - Special Needs []  - Pediatric / Minor Patient Management 0 []  - 0 Isolation Patient Management []  - 0 Hearing / Language / Visual special needs []  - 0 Assessment of Community assistance (transportation, D/C planning, etc.) []  - 0 Additional  assistance / Altered mentation []  - 0 Support Surface(s) Assessment (bed, cushion, seat, etc.) INTERVENTIONS - Wound Cleansing / Measurement X - Simple Wound Cleansing - one wound 1 5 []  - 0  Complex Wound Cleansing - multiple wounds X- 1 5 Wound Imaging (photographs - any number of wounds) []  - 0 Wound Tracing (instead of photographs) X- 1 5 Simple Wound Measurement - one wound []  - 0 Complex Wound Measurement - multiple wounds INTERVENTIONS - Wound Dressings []  - Small Wound Dressing one or multiple wounds 0 []  - 0 Medium Wound Dressing one or multiple wounds []  - 0 Large Wound Dressing one or multiple wounds []  - 0 Application of Medications - topical []  - 0 Application of Medications - injection INTERVENTIONS - Miscellaneous []  - External ear exam 0 []  - 0 Specimen Collection (cultures, biopsies, blood, body fluids, etc.) []  - 0 Specimen(s) / Culture(s) sent or taken to Lab for analysis []  - 0 Patient Transfer (multiple staff / Civil Service fast streamer / Similar devices) []  - 0 Simple Staple / Suture removal (25 or less) []  - 0 Complex Staple / Suture removal (26 or more) []  - 0 Hypo / Hyperglycemic Management (close monitor of Blood Glucose) []  - 0 Ankle / Brachial Index (ABI) - do not check if billed separately X- 1 5 Vital Signs CHRISWELL, Raffaela ([id number]) Has the patient been seen at the hospital within the last three years: Yes Total Score: 55 Level Of Care: New/Established - Level 2 Electronic Signature(s) Signed: 05/13/2017 5:11:52 PM By: Gretta Cool, BSN, RN, CWS, Kim RN, BSN Entered By: Gretta Cool, BSN, RN, CWS, Kim on 05/13/2017 09:19:06 Laura Nolan ([id number]) -------------------------------------------------------------------------------- Encounter Discharge Information Details Patient Name: Laura Nolan Date of Service: 05/13/2017 9:00 AM Medical Record Number: [id number] Patient Account Number: [id number] Date of Birth/Sex: 01-16-1971 (47 y.o. F) Treating RN: Cornell Barman Primary Care Eugina Row: Salome Holmes Other Clinician: Referring Santoria Chason: Salome Holmes Treating Rossie Scarfone/Extender: Tito Dine in Treatment: 5 Encounter Discharge Information Items Discharge Pain Level: 0 Discharge Condition: Stable Ambulatory Status: Cane Discharge Destination: Home Transportation: Private Auto Accompanied By: self Schedule Follow-up Appointment: Yes Medication Reconciliation completed and No provided to Patient/Care Evani Shrider: Provided on Clinical Summary of Care: 05/13/2017 Form Type Recipient Paper Patient VD Electronic Signature(s) Signed: 05/15/2017 3:53:00 PM By: Ruthine Dose Entered By: Ruthine Dose on 05/13/2017 09:18:33 Laura Nolan ([id number]) -------------------------------------------------------------------------------- Lower Extremity Assessment Details Patient Name: Laura Nolan Date of Service: 05/13/2017 9:00 AM Medical Record Number: [id number] Patient Account Number: [id number] Date of Birth/Sex: March 24, 1970 (47 y.o. F) Treating RN: Ahmed Prima Primary Care Azarya Oconnell: Salome Holmes Other Clinician: Referring Inari Shin: Salome Holmes Treating Shailynn Fong/Extender: Tito Dine in Treatment: 5 Electronic Signature(s) Signed: 05/14/2017 5:13:37 PM By: Alric Quan Entered By: Alric Quan on 05/13/2017 [place], Sheridan ([id number]) -------------------------------------------------------------------------------- Multi Wound Chart Details Patient Name: Laura Nolan Date of Service: 05/13/2017 9:00 AM Medical Record Number: [id number] Patient Account Number: [id number] Date of Birth/Sex: Sep 26, 1970 (47 y.o. F) Treating RN: Cornell Barman Primary Care Baraka Klatt: Salome Holmes Other Clinician: Referring Katalyna Socarras: Salome Holmes Treating Takai Chiaramonte/Extender: Tito Dine in Treatment: 5 Vital Signs Height(in): 66 Pulse(bpm): 82 Weight(lbs): 500 Blood Pressure(mmHg): 135/82 Body Mass Index(BMI):  81 Temperature(F): 98.7 Respiratory Rate 18 (breaths/min): Photos: [2:No Photos] [N/A:N/A] Wound Location: [2:Left Gluteal fold] [N/A:N/A] Wounding Event: [2:Gradually Appeared] [N/A:N/A] Primary Etiology: [2:Pressure Ulcer] [N/A:N/A] Comorbid History: [2:Lymphedema, Asthma, Hypertension, Received Chemotherapy] [N/A:N/A] Date Acquired: [2:12/18/2016] [N/A:N/A] Weeks of Treatment: [2:5] [N/A:N/A] Wound Status: [2:Healed - Epithelialized] [N/A:N/A] Clustered Wound: [2:Yes] [N/A:N/A] Clustered Quantity: [2:3] [N/A:N/A] Measurements L x W x D [2:0x0x0] [N/A:N/A] (cm) Area (cm) : [2:0] [N/A:N/A] Volume (cm) : [2:0] [N/A:N/A] % Reduction in Area: [2:100.00%] [N/A:N/A] % Reduction in Volume: [2:100.00%] [  N/A:N/A] Classification: [2:Category/Stage III] [N/A:N/A] Exudate Amount: [2:None Present] [N/A:N/A] Wound Margin: [2:Indistinct, nonvisible] [N/A:N/A] Granulation Amount: [2:Large (67-100%)] [N/A:N/A] Granulation Quality: [2:Red] [N/A:N/A] Necrotic Amount: [2:None Present (0%)] [N/A:N/A] Exposed Structures: [2:Fascia: No Fat Layer (Subcutaneous Tissue) Exposed: No Tendon: No Muscle: No Joint: No Bone: No] [N/A:N/A] Epithelialization: [2:Small (1-33%)] [N/A:N/A] Periwound Skin Texture: [2:Excoriation: No Induration: No Callus: No Crepitus: No] [N/A:N/A] Rash: No Scarring: No Periwound Skin Moisture: Maceration: No N/A N/A Dry/Scaly: No Periwound Skin Color: Atrophie Blanche: No N/A N/A Cyanosis: No Ecchymosis: No Erythema: No Hemosiderin Staining: No Mottled: No Pallor: No Rubor: No Temperature: No Abnormality N/A N/A Tenderness on Palpation: Yes N/A N/A Wound Preparation: Ulcer Cleansing: N/A N/A Rinsed/Irrigated with Saline Topical Anesthetic Applied: None Treatment Notes Electronic Signature(s) Signed: 05/13/2017 5:09:03 PM By: Linton Ham MD Entered By: Linton Ham on 05/13/2017 09:19:51 Laura Nolan  ([id number]) -------------------------------------------------------------------------------- Multi-Disciplinary Care Plan Details Patient Name: Laura Nolan Date of Service: 05/13/2017 9:00 AM Medical Record Number: [id number] Patient Account Number: [id number] Date of Birth/Sex: Nov 28, 1970 (47 y.o. F) Treating RN: Cornell Barman Primary Care Victorious Cosio: Salome Holmes Other Clinician: Referring Erynn Vaca: Salome Holmes Treating Lilja Soland/Extender: Tito Dine in Treatment: 5 Active Inactive Electronic Signature(s) Signed: 05/13/2017 5:11:52 PM By: Gretta Cool, BSN, RN, CWS, Kim RN, BSN Entered By: Gretta Cool, BSN, RN, CWS, Kim on 05/13/2017 09:16:20 Laura Nolan ([id number]) -------------------------------------------------------------------------------- Pain Assessment Details Patient Name: Laura Nolan Date of Service: 05/13/2017 9:00 AM Medical Record Number: [id number] Patient Account Number: [id number] Date of Birth/Sex: Jul 18, 1970 (47 y.o. F) Treating RN: Ahmed Prima Primary Care Kiyoto Slomski: Salome Holmes Other Clinician: Referring Yareli Carthen: Salome Holmes Treating Freddi Forster/Extender: Tito Dine in Treatment: 5 Active Problems Location of Pain Severity and Description of Pain Patient Has Paino No Site Locations Pain Management and Medication Current Pain Management: Electronic Signature(s) Signed: 05/14/2017 5:13:37 PM By: Alric Quan Entered By: Alric Quan on 05/13/2017 09:07:50 Laura Nolan ([id number]) -------------------------------------------------------------------------------- Wound Assessment Details Patient Name: Laura Nolan Date of Service: 05/13/2017 9:00 AM Medical Record Number: [id number] Patient Account Number: [id number] Date of Birth/Sex: [birth year] (47 y.o. F) Treating RN: Cornell Barman Primary Care Madisan Bice: Salome Holmes Other Clinician: Referring Kollin Udell: Salome Holmes Treating Radiance Deady/Extender: Tito Dine in  Treatment: 5 Wound Status Wound Number: 2 Primary Pressure Ulcer Etiology: Wound Location: Left Gluteal fold Wound Status: Healed - Epithelialized Wounding Event: Gradually Appeared Comorbid Lymphedema, Asthma, Hypertension, Date Acquired: 12/18/2016 History: Received Chemotherapy Weeks Of Treatment: 5 Clustered Wound: Yes Photos Photo Uploaded By: Roger Shelter on 05/13/2017 16:14:26 Wound Measurements Length: (cm) 0 % Re Width: (cm) 0 % Re Depth: (cm) 0 Epit Clustered Quantity: 3 Tunn Area: (cm) 0 Und Volume: (cm) 0 duction in Area: 100% duction in Volume: 100% helialization: Small (1-33%) eling: No ermining: No Wound Description Classification: Category/Stage III Wound Margin: Indistinct, nonvisible Exudate Amount: None Present Foul Odor After Cleansing: No Slough/Fibrino Yes Wound Bed Granulation Amount: Large (67-100%) Exposed Structure Granulation Quality: Red Fascia Exposed: No Necrotic Amount: None Present (0%) Fat Layer (Subcutaneous Tissue) Exposed: No Tendon Exposed: No Muscle Exposed: No Joint Exposed: No Bone Exposed: No Periwound Skin Texture Texture Color Seith, Ijanae ([id number]) No Abnormalities Noted: No No Abnormalities Noted: No Callus: No Atrophie Blanche: No Crepitus: No Cyanosis: No Excoriation: No Ecchymosis: No Induration: No Erythema: No Rash: No Hemosiderin Staining: No Scarring: No Mottled: No Pallor: No Moisture Rubor: No No Abnormalities Noted: No Dry / Scaly: No Temperature / Pain Maceration: No Temperature: No Abnormality Tenderness on Palpation: Yes Wound Preparation Ulcer Cleansing: Rinsed/Irrigated  with Saline Topical Anesthetic Applied: None Electronic Signature(s) Signed: 05/13/2017 5:11:52 PM By: Gretta Cool, BSN, RN, CWS, Kim RN, BSN Entered By: Gretta Cool, BSN, RN, CWS, Kim on 05/13/2017 09:17:11 Laura Nolan ([id number]) -------------------------------------------------------------------------------- Vitals  Details Patient Name: Laura Nolan Date of Service: 05/13/2017 9:00 AM Medical Record Number: [id number] Patient Account Number: [id number] Date of Birth/Sex: Mar 21, 1970 (47 y.o. F) Treating RN: Ahmed Prima Primary Care Tracie Dore: Salome Holmes Other Clinician: Referring Bassel Gaskill: Salome Holmes Treating Lajuanna Pompa/Extender: Tito Dine in Treatment: 5 Vital Signs Time Taken: 09:07 Temperature (F): 98.7 Height (in): 66 Pulse (bpm): 82 Weight (lbs): 500 Respiratory Rate (breaths/min): 18 Body Mass Index (BMI): 80.7 Blood Pressure (mmHg): 135/82 Reference Range: 80 - 120 mg / dl Electronic Signature(s) Signed: 05/14/2017 5:13:37 PM By: Alric Quan Entered By: Alric Quan on 05/13/2017 09:09:42

## 2018-06-29 ENCOUNTER — Other Ambulatory Visit: Payer: Self-pay

## 2018-06-29 ENCOUNTER — Emergency Department: Payer: 59

## 2018-06-29 ENCOUNTER — Emergency Department
Admission: EM | Admit: 2018-06-29 | Discharge: 2018-06-29 | Disposition: A | Discharge status: Home / Self Care | Payer: 59 | Attending: Emergency Medicine | Admitting: Emergency Medicine

## 2018-06-29 DIAGNOSIS — I129 Hypertensive chronic kidney disease with stage 1 through stage 4 chronic kidney disease, or unspecified chronic kidney disease: Secondary | ICD-10-CM | POA: Insufficient documentation

## 2018-06-29 DIAGNOSIS — N183 Chronic kidney disease, stage 3 (moderate): Secondary | ICD-10-CM | POA: Insufficient documentation

## 2018-06-29 DIAGNOSIS — Z8049 Family history of malignant neoplasm of other genital organs: Secondary | ICD-10-CM | POA: Insufficient documentation

## 2018-06-29 DIAGNOSIS — Z79899 Other long term (current) drug therapy: Secondary | ICD-10-CM | POA: Diagnosis not present

## 2018-06-29 DIAGNOSIS — Z20828 Contact with and (suspected) exposure to other viral communicable diseases: Secondary | ICD-10-CM | POA: Insufficient documentation

## 2018-06-29 DIAGNOSIS — R509 Fever, unspecified: Secondary | ICD-10-CM | POA: Diagnosis not present

## 2018-06-29 LAB — COMPREHENSIVE METABOLIC PANEL
ALT: 16 U/L (ref 0–44)
AST: 13 U/L — ABNORMAL LOW (ref 15–41)
Albumin: 3.7 g/dL (ref 3.5–5.0)
Alkaline Phosphatase: 69 U/L (ref 38–126)
Anion gap: 8 (ref 5–15)
BUN: 13 mg/dL (ref 6–20)
CO2: 26 mmol/L (ref 22–32)
Calcium: 8.6 mg/dL — ABNORMAL LOW (ref 8.9–10.3)
Chloride: 103 mmol/L (ref 98–111)
Creatinine, Ser: 1.28 mg/dL — ABNORMAL HIGH (ref 0.44–1.00)
GFR calc Af Amer: 58 mL/min — ABNORMAL LOW (ref >60)
GFR calc non Af Amer: 50 mL/min — ABNORMAL LOW (ref >60)
Glucose, Bld: 108 mg/dL — ABNORMAL HIGH (ref 70–99)
Potassium: 4.1 mmol/L (ref 3.5–5.1)
Sodium: 137 mmol/L (ref 135–145)
Total Bilirubin: 1.2 mg/dL (ref 0.3–1.2)
Total Protein: 7.5 g/dL (ref 6.5–8.1)

## 2018-06-29 LAB — CBC WITH DIFFERENTIAL/PLATELET
Abs Immature Granulocytes: 0.05 10*3/uL (ref 0.00–0.07)
Basophils Absolute: 0 10*3/uL (ref 0.0–0.1)
Basophils Relative: 0 %
Eosinophils Absolute: 0.1 10*3/uL (ref 0.0–0.5)
Eosinophils Relative: 1 %
HCT: 39.1 % (ref 36.0–46.0)
Hemoglobin: 12.5 g/dL (ref 12.0–15.0)
Immature Granulocytes: 1 %
Lymphocytes Relative: 13 %
Lymphs Abs: 1.3 10*3/uL (ref 0.7–4.0)
MCH: 29.4 pg (ref 26.0–34.0)
MCHC: 32 g/dL (ref 30.0–36.0)
MCV: 92 fL (ref 80.0–100.0)
Monocytes Absolute: 0.6 10*3/uL (ref 0.1–1.0)
Monocytes Relative: 7 %
Neutro Abs: 7.5 10*3/uL (ref 1.7–7.7)
Neutrophils Relative %: 78 %
Platelets: 255 10*3/uL (ref 150–400)
RBC: 4.25 MIL/uL (ref 3.87–5.11)
RDW: 14.8 % (ref 11.5–15.5)
WBC: 9.6 10*3/uL (ref 4.0–10.5)
nRBC: 0 % (ref 0.0–0.2)

## 2018-06-29 LAB — URINALYSIS, COMPLETE (UACMP) WITH MICROSCOPIC
Bacteria, UA: NONE SEEN
Bilirubin Urine: NEGATIVE
Glucose, UA: NEGATIVE mg/dL
Hgb urine dipstick: NEGATIVE
Ketones, ur: NEGATIVE mg/dL
Leukocytes,Ua: NEGATIVE
Nitrite: NEGATIVE
Protein, ur: 30 mg/dL — AB
Specific Gravity, Urine: 1.021 (ref 1.005–1.030)
WBC, UA: NONE SEEN WBC/hpf (ref 0–5)
pH: 6 (ref 5.0–8.0)

## 2018-06-29 LAB — SARS CORONAVIRUS 2 BY RT PCR (HOSPITAL ORDER, PERFORMED IN ~~LOC~~ HOSPITAL LAB): SARS Coronavirus 2: NEGATIVE

## 2018-06-29 LAB — POCT PREGNANCY, URINE: Preg Test, Ur: NEGATIVE

## 2018-06-29 MED ORDER — CEPHALEXIN 500 MG PO CAPS: 500.0000 mg | ORAL_CAPSULE | Freq: Three times a day (TID) | ORAL | 0 refills | Status: DC

## 2018-06-29 NOTE — ED Notes (Signed)
See triage note  States she thinks she may have a UTI  States she developed fever yesterday of 102 at home  Then had fever again this am   Low grade on arrival  Hx of chronic UTI's and now having some freq

## 2018-06-29 NOTE — ED Provider Notes (Signed)
[facility] Emergency Department Provider Note  ___________________________________________   First MD Initiated Contact with Patient 06/29/18 1234     (approximate)  I have reviewed the triage vital signs and the nursing notes.   HISTORY  Chief Complaint Urinary Frequency   HPI Laura Nolan is a 48 y.o. female presents to the ED with complaint of fever that developed yesterday at home.  She states she took her temperature and it was 102.  She is has assumed that she has a urinary tract infection but denies any urinary frequency, nausea, vomiting or flank pain.  She has in the past had UTIs and also history of cellulitis.  She denies any upper respiratory symptoms.  Patient is still ambulatory and has been eating and drinking without any difficulties.  She also is unaware of any exposure to COVID.         Past Medical History:  Diagnosis Date  . Arthralgia   . Chronic renal disease, stage 3, moderately decreased glomerular filtration rate (GFR) between 30-59 mL/min/1.73 square meter (HCC)   . Depression   . Elevated LFTs   . Endometrial cancer ([place])   . Endometrial cancer ([place])   . GERD (gastroesophageal reflux disease)   . Hypertension   . Morbid obesity ([place])   . Neuropathy due to chemotherapeutic drug ([place])   . Thyroid disease   . TMJ (dislocation of temporomandibular joint)   . Vitamin B12 deficiency   . Vitamin D deficiency     Patient Active Problem List   Diagnosis Date Noted  . Pressure injury of skin 12/19/2016  . Cellulitis of left buttock 12/17/2016  . Tonsillar abscess 11/11/2015    Past Surgical History:  Procedure Laterality Date  . CHOLECYSTECTOMY    . FRACTURE SURGERY    . NEPHRECTOMY Right     Prior to Admission medications   Medication Sig Start Date End Date Taking? Authorizing Provider  acetaminophen (TYLENOL) 500 MG tablet Take 1,000 mg by mouth every 6 (six) hours as needed for mild pain.    [provider]  albuterol (PROVENTIL HFA;VENTOLIN HFA) 108 (90 Base) MCG/ACT inhaler Inhale 2 puffs into the lungs every 6 (six) hours as needed for wheezing or shortness of breath.    [provider]  buPROPion (WELLBUTRIN XL) 150 MG 24 hr tablet Take 150 mg by mouth 2 (two) times daily.    [provider]  cephALEXin (KEFLEX) 500 MG capsule Take 1 capsule (500 mg total) by mouth 3 (three) times daily. 06/29/18   Johnn Hai, PA-C  diphenhydrAMINE (BENADRYL) 25 MG tablet Take 25 mg by mouth every 6 (six) hours as needed for allergies.    [provider]  ergocalciferol (VITAMIN D2) 50000 units capsule Take 50,000 Units by mouth once a week. Patient takes on Wednesday.    [provider]  levothyroxine (SYNTHROID, LEVOTHROID) 75 MCG tablet Take 75 mcg by mouth daily before breakfast.    [provider]  losartan (COZAAR) 50 MG tablet Take 50 mg by mouth daily.    [provider]  ondansetron (ZOFRAN ODT) 4 MG disintegrating tablet Take 1 tablet (4 mg total) every 8 (eight) hours as needed by mouth for nausea or vomiting. 12/15/16   Loney Hering, MD    Allergies Lisinopril; Macrodantin [nitrofurantoin macrocrystal]; and Sulfa antibiotics  Family History  Problem Relation Age of Onset  . Hypertension Mother   . Diabetes Father   . Hypertension Father  Social History Social History   Tobacco Use  . Smoking status: Never Smoker  . Smokeless tobacco: Never Used  Substance Use Topics  . Alcohol use: Yes  . Drug use: Not on file    Review of Systems Constitutional: Positive fever/negative chills Eyes: No visual changes. ENT: No sore throat. Cardiovascular: Denies chest pain. Respiratory: Denies shortness of breath.  Negative for cough. Gastrointestinal: No abdominal pain.  No nausea, no vomiting.  No diarrhea.  No constipation. Genitourinary: Negative for dysuria, frequency or hematuria. Musculoskeletal: Negative for muscle  aches. Skin: Negative for rash. Neurological: Negative for headaches, focal weakness or numbness. ___________________________________________   PHYSICAL EXAM:  VITAL SIGNS: ED Triage Vitals  Enc Vitals Group     BP 06/29/18 1229 (!) 156/73     Pulse Rate 06/29/18 1229 97     Resp 06/29/18 1229 17     Temp 06/29/18 1229 100.2 F (37.9 C)     Temp Source 06/29/18 1229 Oral     SpO2 06/29/18 1229 93 %     Weight 06/29/18 1230 (!) 500 lb (226.8 kg)     Height 06/29/18 1230 5\' 6"  (1.676 m)     Head Circumference --      Peak Flow --      Pain Score 06/29/18 1230 0     Pain Loc --      Pain Edu? --      Excl. in [place]? --     Constitutional: Alert and oriented. Well appearing and in no acute distress.  Nontoxic in appearance and morbidly obese. Eyes: Conjunctivae are normal.  Head: Atraumatic. Nose: No congestion/rhinnorhea. Mouth/Throat: Mucous membranes are moist.  Oropharynx non-erythematous. Neck: No stridor.   Hematological/Lymphatic/Immunilogical: No cervical lymphadenopathy. Cardiovascular: Normal rate, regular rhythm. Grossly normal heart sounds.  Good peripheral circulation. Respiratory: Normal respiratory effort.  No retractions. Lungs CTAB. Gastrointestinal: Soft and nontender. No distention.  Musculoskeletal: Nontender joints or thoracic or lumbar spine.  Anatomically is difficult to localize landmarks as to patient's extreme morbid obesity. Neurologic:  Normal speech and language. No gross focal neurologic deficits are appreciated. Skin:  Skin is warm.  Skin on the lower extremities shows peripheral vascular changes but no venous stasis ulcers were noted.  There is also on the left buttocks and area that is weeping what appears to be serosanguineous fluid.  There is no area that is suspicious for abscess or cellulitis.  Patient does have chronic changes to her skin secondary to her obesity. Psychiatric: Mood and affect are normal. Speech and behavior are normal.   ____________________________________________   LABS (all labs ordered are listed, but only abnormal results are displayed)  Labs Reviewed  URINALYSIS, COMPLETE (UACMP) WITH MICROSCOPIC - Abnormal; Notable for the following components:      Result Value   Color, Urine YELLOW (*)    APPearance CLOUDY (*)    Protein, ur 30 (*)    All other components within normal limits  COMPREHENSIVE METABOLIC PANEL - Abnormal; Notable for the following components:   Glucose, Bld 108 (*)    Creatinine, Ser 1.28 (*)    Calcium 8.6 (*)    AST 13 (*)    GFR calc non Af Amer 50 (*)    GFR calc Af Amer 58 (*)    All other components within normal limits  SARS CORONAVIRUS 2 (HOSPITAL ORDER, [place] [facility])  URINE CULTURE  CULTURE, BLOOD (SINGLE)  AEROBIC CULTURE (SUPERFICIAL SPECIMEN)  CBC WITH DIFFERENTIAL/PLATELET  POC  URINE PREG, ED  POCT PREGNANCY, URINE    RADIOLOGY   Official radiology report(s): Dg Chest Portable 1 View  Result Date: 06/29/2018 CLINICAL DATA:  Fever EXAM: PORTABLE CHEST 1 VIEW COMPARISON:  Portable exam 4235 hours without priors for comparison FINDINGS: Lordotic positioning with rotation to the RIGHT. Normal heart size, mediastinal contours, and pulmonary vascularity. Lungs grossly clear. No definite infiltrate, pleural effusion or pneumothorax. IMPRESSION: No acute abnormalities. Electronically Signed   By: Lavonia Dana M.D.   On: 06/29/2018 14:05    ____________________________________________   PROCEDURES  Procedure(s) performed (including Critical Care):  Procedures   ____________________________________________   INITIAL IMPRESSION / ASSESSMENT AND PLAN / ED COURSE  As part of my medical decision making, I reviewed the following data within the electronic MEDICAL RECORD NUMBER Notes from prior ED visits and [place] Controlled Substance Database  Patient presents to the ED with complaint of fever of 102 last evening and assumed that she had a  urinary tract infection.  Urinalysis was negative for infection and other sources of infection was explored.  CBC was within normal limits.  Metabolic panel was consistent with her previous results with her creatinine being stable at 1.28.  Chest x-ray was reported as negative per radiologist.  COVID test reported negative.  I spoke with Dr. Burlene Arnt regarding care of this patient.  Patient was nontoxic.  A set of blood cultures, urine culture and skin culture was obtained from the area that was weeping.  Patient empirically was placed on Keflex until cultures returned.  She is aware that she should return to the emergency department if any severe worsening of her symptoms especially if she develops any respiratory difficulties.  Patient was discharged in stable condition and was ambulatory with the assistance of her walker that she normally uses on a daily basis.  ____________________________________________   FINAL CLINICAL IMPRESSION(S) / ED DIAGNOSES  Final diagnoses:  Fever, unspecified fever cause     ED Discharge Orders         Ordered    cephALEXin (KEFLEX) 500 MG capsule  3 times daily     06/29/18 1539           Note:  This document was prepared using Dragon voice recognition software and may include unintentional dictation errors.    Johnn Hai, PA-C 06/29/18 1640    Lavonia Drafts, MD 07/07/18 2034

## 2018-06-29 NOTE — ED Triage Notes (Signed)
Pt c/o painful urination with a fever that started a couple of days, states she has a hx of chronic UTI

## 2018-06-29 NOTE — Discharge Instructions (Addendum)
Follow-up with your primary care provider if any continued problems.  Return to the emergency department if any sudden changes, worsening or difficulty breathing.  A urine culture, blood culture, wound culture was obtained and you were being placed on a antibiotic.  You may take Tylenol as needed for fever.  The remainder of today's lab work was within normal limits.  Should your cultures result as anything that needs to be reported, nursing staff will call you at the number you provided registration.

## 2018-06-29 NOTE — ED Notes (Signed)
Walked covid swab to lab

## 2018-06-30 LAB — URINE CULTURE: Special Requests: NORMAL

## 2018-07-01 LAB — AEROBIC CULTURE? (SUPERFICIAL SPECIMEN)

## 2018-07-01 LAB — AEROBIC CULTURE W GRAM STAIN (SUPERFICIAL SPECIMEN): Special Requests: NORMAL

## 2018-07-02 ENCOUNTER — Other Ambulatory Visit: Payer: Self-pay

## 2018-07-02 ENCOUNTER — Emergency Department: Payer: 59

## 2018-07-02 ENCOUNTER — Emergency Department
Admission: EM | Admit: 2018-07-02 | Discharge: 2018-07-02 | Disposition: A | Discharge status: Home / Self Care | Payer: 59 | Attending: Emergency Medicine | Admitting: Emergency Medicine

## 2018-07-02 DIAGNOSIS — L03116 Cellulitis of left lower limb: Secondary | ICD-10-CM

## 2018-07-02 DIAGNOSIS — N183 Chronic kidney disease, stage 3 (moderate): Secondary | ICD-10-CM | POA: Insufficient documentation

## 2018-07-02 DIAGNOSIS — Z8542 Personal history of malignant neoplasm of other parts of uterus: Secondary | ICD-10-CM | POA: Diagnosis not present

## 2018-07-02 DIAGNOSIS — R0602 Shortness of breath: Secondary | ICD-10-CM | POA: Insufficient documentation

## 2018-07-02 DIAGNOSIS — Z20828 Contact with and (suspected) exposure to other viral communicable diseases: Secondary | ICD-10-CM | POA: Insufficient documentation

## 2018-07-02 DIAGNOSIS — R509 Fever, unspecified: Secondary | ICD-10-CM | POA: Diagnosis not present

## 2018-07-02 DIAGNOSIS — I129 Hypertensive chronic kidney disease with stage 1 through stage 4 chronic kidney disease, or unspecified chronic kidney disease: Secondary | ICD-10-CM | POA: Insufficient documentation

## 2018-07-02 DIAGNOSIS — R531 Weakness: Secondary | ICD-10-CM | POA: Insufficient documentation

## 2018-07-02 DIAGNOSIS — L539 Erythematous condition, unspecified: Secondary | ICD-10-CM | POA: Diagnosis present

## 2018-07-02 DIAGNOSIS — Z9221 Personal history of antineoplastic chemotherapy: Secondary | ICD-10-CM | POA: Diagnosis not present

## 2018-07-02 LAB — COMPREHENSIVE METABOLIC PANEL
ALT: 30 U/L (ref 0–44)
AST: 29 U/L (ref 15–41)
Albumin: 3 g/dL — ABNORMAL LOW (ref 3.5–5.0)
Alkaline Phosphatase: 70 U/L (ref 38–126)
Anion gap: 12 (ref 5–15)
BUN: 16 mg/dL (ref 6–20)
CO2: 22 mmol/L (ref 22–32)
Calcium: 8.1 mg/dL — ABNORMAL LOW (ref 8.9–10.3)
Chloride: 101 mmol/L (ref 98–111)
Creatinine, Ser: 1.39 mg/dL — ABNORMAL HIGH (ref 0.44–1.00)
GFR calc Af Amer: 53 mL/min — ABNORMAL LOW (ref >60)
GFR calc non Af Amer: 45 mL/min — ABNORMAL LOW (ref >60)
Glucose, Bld: 122 mg/dL — ABNORMAL HIGH (ref 70–99)
Potassium: 4 mmol/L (ref 3.5–5.1)
Sodium: 135 mmol/L (ref 135–145)
Total Bilirubin: 1.5 mg/dL — ABNORMAL HIGH (ref 0.3–1.2)
Total Protein: 7.2 g/dL (ref 6.5–8.1)

## 2018-07-02 LAB — CBC WITH DIFFERENTIAL/PLATELET
Abs Immature Granulocytes: 0.26 10*3/uL — ABNORMAL HIGH (ref 0.00–0.07)
Basophils Absolute: 0.1 10*3/uL (ref 0.0–0.1)
Basophils Relative: 1 %
Eosinophils Absolute: 0.2 10*3/uL (ref 0.0–0.5)
Eosinophils Relative: 2 %
HCT: 38.3 % (ref 36.0–46.0)
Hemoglobin: 12.1 g/dL (ref 12.0–15.0)
Immature Granulocytes: 3 %
Lymphocytes Relative: 6 %
Lymphs Abs: 0.6 10*3/uL — ABNORMAL LOW (ref 0.7–4.0)
MCH: 29.2 pg (ref 26.0–34.0)
MCHC: 31.6 g/dL (ref 30.0–36.0)
MCV: 92.3 fL (ref 80.0–100.0)
Monocytes Absolute: 0.7 10*3/uL (ref 0.1–1.0)
Monocytes Relative: 7 %
Neutro Abs: 8.6 10*3/uL — ABNORMAL HIGH (ref 1.7–7.7)
Neutrophils Relative %: 81 %
Platelets: 300 10*3/uL (ref 150–400)
RBC: 4.15 MIL/uL (ref 3.87–5.11)
RDW: 14.6 % (ref 11.5–15.5)
WBC: 10.5 10*3/uL (ref 4.0–10.5)
nRBC: 0 % (ref 0.0–0.2)

## 2018-07-02 LAB — TSH: TSH: 3.11 u[IU]/mL (ref 0.350–4.500)

## 2018-07-02 LAB — TROPONIN I: Troponin I: <0.03 ng/mL (ref <0.03)

## 2018-07-02 LAB — SARS CORONAVIRUS 2 BY RT PCR (HOSPITAL ORDER, PERFORMED IN ~~LOC~~ HOSPITAL LAB): SARS Coronavirus 2: NEGATIVE

## 2018-07-02 LAB — LACTIC ACID, PLASMA: Lactic Acid, Venous: 1.6 mmol/L (ref 0.5–1.9)

## 2018-07-02 LAB — BRAIN NATRIURETIC PEPTIDE: B Natriuretic Peptide: 37 pg/mL (ref 0.0–100.0)

## 2018-07-02 MED ORDER — CLINDAMYCIN HCL 150 MG PO CAPS: 300.0000 mg | ORAL_CAPSULE | Freq: Three times a day (TID) | ORAL | 0 refills | Status: DC

## 2018-07-02 NOTE — ED Notes (Signed)
Pt states recently in hospital for similar symptoms. States couldn't find anything specifically wrong then and was sent home Tuesday on keflex.

## 2018-07-02 NOTE — ED Notes (Signed)
Pt verbalized understanding of discharge instructions. NAD at this time. 

## 2018-07-02 NOTE — ED Triage Notes (Addendum)
Pt in via [facility] from [facility] d/t c/o "lack of energy." States N but no V/D. States more SOB than usual. Pt from home with significant other and kids. History cellulitis and lympadema on L side. Fever since Sunday per pt. 102.9 oral highest at home. 97.7 oral with EMS. 97% RA/NSR/BG 162/CO2 27 with EMS. 20g R ac placed by EMS. Pt A&Ox4.

## 2018-07-02 NOTE — Discharge Instructions (Addendum)
Follow-up with your regular doctor.  Take medication as prescribed.  Return if worsening 

## 2018-07-02 NOTE — ED Provider Notes (Signed)
[facility] Emergency Department Provider Note  ____________________________________________   None    (approximate)  I have reviewed the triage vital signs and the nursing notes.   HISTORY  Chief Complaint Weakness    HPI Laura Nolan is a 48 y.o. female presents emergency department  via EMS from home.  Patient states that she has been treated for cellulitis with a saddlebag area but is still having a lot of redness and drainage.  States she has a complete lack of energy.  She felt febrile with some shortness of breath.  States she has had a fever since Sunday.  Temp 102.9.  She denies chest pain.  She denies vomiting or diarrhea   Past Medical History:  Diagnosis Date  . Arthralgia   . Chronic renal disease, stage 3, moderately decreased glomerular filtration rate (GFR) between 30-59 mL/min/1.73 square meter (HCC)   . Depression   . Elevated LFTs   . Endometrial cancer ([place])   . Endometrial cancer ([place])   . GERD (gastroesophageal reflux disease)   . Hypertension   . Morbid obesity ([place])   . Neuropathy due to chemotherapeutic drug ([place])   . Thyroid disease   . TMJ (dislocation of temporomandibular joint)   . Vitamin B12 deficiency   . Vitamin D deficiency     Patient Active Problem List   Diagnosis Date Noted  . Pressure injury of skin 12/19/2016  . Cellulitis of left buttock 12/17/2016  . Tonsillar abscess 11/11/2015    Past Surgical History:  Procedure Laterality Date  . CHOLECYSTECTOMY    . FRACTURE SURGERY    . NEPHRECTOMY Right     Prior to Admission medications   Medication Sig Start Date End Date Taking? Authorizing Provider  acetaminophen (TYLENOL) 500 MG tablet Take 1,000 mg by mouth every 6 (six) hours as needed for mild pain.    [provider]  albuterol (PROVENTIL HFA;VENTOLIN HFA) 108 (90 Base) MCG/ACT inhaler Inhale 2 puffs into the lungs every 6 (six) hours as needed for wheezing or shortness of breath.     [provider]  buPROPion (WELLBUTRIN XL) 150 MG 24 hr tablet Take 150 mg by mouth 2 (two) times daily.    [provider]  cephALEXin (KEFLEX) 500 MG capsule Take 1 capsule (500 mg total) by mouth 3 (three) times daily. 06/29/18   Johnn Hai, PA-C  clindamycin (CLEOCIN) 150 MG capsule Take 2 capsules (300 mg total) by mouth 3 (three) times daily. 07/02/18   , Linden Dolin, PA-C  diphenhydrAMINE (BENADRYL) 25 MG tablet Take 25 mg by mouth every 6 (six) hours as needed for allergies.    [provider]  ergocalciferol (VITAMIN D2) 50000 units capsule Take 50,000 Units by mouth once a week. Patient takes on Wednesday.    [provider]  levothyroxine (SYNTHROID, LEVOTHROID) 75 MCG tablet Take 75 mcg by mouth daily before breakfast.    [provider]  losartan (COZAAR) 50 MG tablet Take 50 mg by mouth daily.    [provider]  ondansetron (ZOFRAN ODT) 4 MG disintegrating tablet Take 1 tablet (4 mg total) every 8 (eight) hours as needed by mouth for nausea or vomiting. 12/15/16   Loney Hering, MD    Allergies Lisinopril; Macrodantin [nitrofurantoin macrocrystal]; and Sulfa antibiotics  Family History  Problem Relation Age of Onset  . Hypertension Mother   . Diabetes Father   . Hypertension Father     Social History Social History  Tobacco Use  . Smoking status: Never Smoker  . Smokeless tobacco: Never Used  Substance Use Topics  . Alcohol use: Yes  . Drug use: Not on file    Review of Systems  Constitutional: +fever/chills  Eyes: No visual changes. ENT: No sore throat. Respiratory: Denies cough,  Sob Cardiovascular: denies cp Genitourinary: Negative for dysuria. Musculoskeletal: Negative for back pain. Skin: Negative for rash.    ____________________________________________   PHYSICAL EXAM:  VITAL SIGNS: ED Triage Vitals  Enc Vitals Group     BP 07/02/18 1322 (!) 155/87     Pulse Rate 07/02/18 1323  93     Resp 07/02/18 1327 17     Temp 07/02/18 1410 98.6 F (37 C)     Temp Source 07/02/18 1410 Oral     SpO2 07/02/18 1323 99 %     Weight 07/02/18 1318 (!) 500 lb (226.8 kg)     Height 07/02/18 1318 5\' 6"  (1.676 m)     Head Circumference --      Peak Flow --      Pain Score 07/02/18 1318 0     Pain Loc --      Pain Edu? --      Excl. in [place]? --     Constitutional: Alert and oriented. Well appearing and in no acute distress. Eyes: Conjunctivae are normal.  Head: Atraumatic. Nose: No congestion/rhinnorhea. Mouth/Throat: Mucous membranes are moist.   Neck:  supple no lymphadenopathy noted Cardiovascular: Normal rate, regular rhythm. Heart sounds are normal Respiratory: Normal respiratory effort.  No retractions, lungs c t a  Abd: soft nontender bs normal all 4 quad GU: deferred Musculoskeletal: FROM all extremities, warm and well perfused Neurologic:  Normal speech and language.  Skin:  Skin is warm, dry and intact. No rash noted. Psychiatric: Mood and affect are normal. Speech and behavior are normal.  ____________________________________________   LABS (all labs ordered are listed, but only abnormal results are displayed)  Labs Reviewed  COMPREHENSIVE METABOLIC PANEL - Abnormal; Notable for the following components:      Result Value   Glucose, Bld 122 (*)    Creatinine, Ser 1.39 (*)    Calcium 8.1 (*)    Albumin 3.0 (*)    Total Bilirubin 1.5 (*)    GFR calc non Af Amer 45 (*)    GFR calc Af Amer 53 (*)    All other components within normal limits  CBC WITH DIFFERENTIAL/PLATELET - Abnormal; Notable for the following components:   Neutro Abs 8.6 (*)    Lymphs Abs 0.6 (*)    Abs Immature Granulocytes 0.26 (*)    All other components within normal limits  SARS CORONAVIRUS 2 (HOSPITAL ORDER, [place] [facility])  TROPONIN I  LACTIC ACID, PLASMA  BRAIN NATRIURETIC PEPTIDE  TSH  LACTIC ACID, PLASMA  URINALYSIS, COMPLETE (UACMP) WITH MICROSCOPIC    ____________________________________________   ____________________________________________  RADIOLOGY  Chest x-ray is normal  ____________________________________________   PROCEDURES  Procedure(s) performed: No  Procedures    ____________________________________________   INITIAL IMPRESSION / ASSESSMENT AND PLAN / ED COURSE  Pertinent labs & imaging results that were available during my care of the patient were reviewed by me and considered in my medical decision making (see chart for details).   Patient is 48 year old female presents emergency department complaining of cellulitis, weakness, and some shortness of breath  Physical exam patient is morbidly obese with large "saddlebags "on each thigh.  The left area is  very red and tender with a clear drainage.  Remainder the exam is unremarkable  CBC is normal, comprehensive metabolic panel is basically normal, COVID-19 test is normal, lactic acid normal, BMP is normal, TSH is normal  Explained all the findings to the patient.  Explained to her we will change her antibiotic to clindamycin since she is allergic to sulfa.  She is to follow-up with her regular doctor if not better in 3 days.  Return emergency department worsening.  She states she understands will comply.  She discharged stable condition.    As part of my medical decision making, I reviewed the following data within the [place] notes reviewed and incorporated, Labs reviewed see above, Old chart reviewed, Radiograph reviewed this x-ray normal, Evaluated by EM attending Joni Fears, Notes from prior ED visits and [place] Controlled Substance Database  ____________________________________________   FINAL CLINICAL IMPRESSION(S) / ED DIAGNOSES  Final diagnoses:  Cellulitis of left lower extremity      NEW MEDICATIONS STARTED DURING THIS VISIT:  Discharge Medication List as of 07/02/2018  5:00 PM    START taking these medications    Details  clindamycin (CLEOCIN) 150 MG capsule Take 2 capsules (300 mg total) by mouth 3 (three) times daily., Starting Fri 07/02/2018, Print         Note:  This document was prepared using Dragon voice recognition software and may include unintentional dictation errors.    Versie Starks, PA-C 07/02/18 2011    Carrie Mew, MD 07/03/18 2329

## 2018-07-02 NOTE — ED Notes (Signed)
Warm blanket given to pt

## 2018-07-02 NOTE — ED Notes (Signed)
Pt states she didn't take tylenol or other OTC meds today.

## 2018-07-02 NOTE — ED Notes (Signed)
EKG completed

## 2018-07-02 NOTE — ED Notes (Signed)
Grn/blue/lav/lactic sent to lab.

## 2018-07-02 NOTE — ED Notes (Signed)
Dressing applied to left side wound.

## 2018-07-02 NOTE — ED Notes (Signed)
Pt uncomfortable in stretcher; switching pt to [place] bed now.

## 2018-07-04 LAB — CULTURE, BLOOD (SINGLE): Culture: NO GROWTH

## 2019-01-04 ENCOUNTER — Other Ambulatory Visit: Payer: Self-pay

## 2019-01-04 ENCOUNTER — Emergency Department: Payer: 59

## 2019-01-04 ENCOUNTER — Observation Stay
Admission: EM | Admit: 2019-01-04 | Discharge: 2019-01-06 | Disposition: A | Discharge status: Home / Self Care | Payer: 59 | Attending: Internal Medicine | Admitting: Internal Medicine

## 2019-01-04 DIAGNOSIS — N1831 Chronic kidney disease, stage 3a: Secondary | ICD-10-CM

## 2019-01-04 DIAGNOSIS — Z6841 Body Mass Index (BMI) 40.0 and over, adult: Secondary | ICD-10-CM | POA: Insufficient documentation

## 2019-01-04 DIAGNOSIS — A419 Sepsis, unspecified organism: Secondary | ICD-10-CM | POA: Diagnosis present

## 2019-01-04 DIAGNOSIS — I890 Lymphedema, not elsewhere classified: Secondary | ICD-10-CM | POA: Insufficient documentation

## 2019-01-04 DIAGNOSIS — Z20828 Contact with and (suspected) exposure to other viral communicable diseases: Secondary | ICD-10-CM | POA: Insufficient documentation

## 2019-01-04 DIAGNOSIS — Z79899 Other long term (current) drug therapy: Secondary | ICD-10-CM | POA: Insufficient documentation

## 2019-01-04 DIAGNOSIS — I129 Hypertensive chronic kidney disease with stage 1 through stage 4 chronic kidney disease, or unspecified chronic kidney disease: Secondary | ICD-10-CM | POA: Diagnosis not present

## 2019-01-04 DIAGNOSIS — C541 Malignant neoplasm of endometrium: Secondary | ICD-10-CM | POA: Diagnosis not present

## 2019-01-04 DIAGNOSIS — I10 Essential (primary) hypertension: Secondary | ICD-10-CM

## 2019-01-04 DIAGNOSIS — Z79890 Hormone replacement therapy (postmenopausal): Secondary | ICD-10-CM | POA: Diagnosis not present

## 2019-01-04 DIAGNOSIS — F329 Major depressive disorder, single episode, unspecified: Secondary | ICD-10-CM | POA: Insufficient documentation

## 2019-01-04 DIAGNOSIS — L0390 Cellulitis, unspecified: Secondary | ICD-10-CM | POA: Diagnosis present

## 2019-01-04 DIAGNOSIS — N183 Chronic kidney disease, stage 3 unspecified: Secondary | ICD-10-CM | POA: Diagnosis not present

## 2019-01-04 DIAGNOSIS — L03317 Cellulitis of buttock: Secondary | ICD-10-CM | POA: Insufficient documentation

## 2019-01-04 LAB — COMPREHENSIVE METABOLIC PANEL
ALT: 12 U/L (ref 0–44)
AST: 15 U/L (ref 15–41)
Albumin: 3.7 g/dL (ref 3.5–5.0)
Alkaline Phosphatase: 66 U/L (ref 38–126)
Anion gap: 11 (ref 5–15)
BUN: 14 mg/dL (ref 6–20)
CO2: 24 mmol/L (ref 22–32)
Calcium: 8.8 mg/dL — ABNORMAL LOW (ref 8.9–10.3)
Chloride: 102 mmol/L (ref 98–111)
Creatinine, Ser: 1.15 mg/dL — ABNORMAL HIGH (ref 0.44–1.00)
GFR calc Af Amer: >60 mL/min (ref >60)
GFR calc non Af Amer: 57 mL/min — ABNORMAL LOW (ref >60)
Glucose, Bld: 113 mg/dL — ABNORMAL HIGH (ref 70–99)
Potassium: 4 mmol/L (ref 3.5–5.1)
Sodium: 137 mmol/L (ref 135–145)
Total Bilirubin: 1.5 mg/dL — ABNORMAL HIGH (ref 0.3–1.2)
Total Protein: 7.7 g/dL (ref 6.5–8.1)

## 2019-01-04 LAB — CBC WITH DIFFERENTIAL/PLATELET
Abs Immature Granulocytes: 0.05 10*3/uL (ref 0.00–0.07)
Basophils Absolute: 0.1 10*3/uL (ref 0.0–0.1)
Basophils Relative: 0 %
Eosinophils Absolute: 0.1 10*3/uL (ref 0.0–0.5)
Eosinophils Relative: 1 %
HCT: 41.4 % (ref 36.0–46.0)
Hemoglobin: 13.6 g/dL (ref 12.0–15.0)
Immature Granulocytes: 0 %
Lymphocytes Relative: 6 %
Lymphs Abs: 0.7 10*3/uL (ref 0.7–4.0)
MCH: 28.2 pg (ref 26.0–34.0)
MCHC: 32.9 g/dL (ref 30.0–36.0)
MCV: 85.7 fL (ref 80.0–100.0)
Monocytes Absolute: 0.3 10*3/uL (ref 0.1–1.0)
Monocytes Relative: 3 %
Neutro Abs: 10.2 10*3/uL — ABNORMAL HIGH (ref 1.7–7.7)
Neutrophils Relative %: 90 %
Platelets: 295 10*3/uL (ref 150–400)
RBC: 4.83 MIL/uL (ref 3.87–5.11)
RDW: 15 % (ref 11.5–15.5)
WBC: 11.4 10*3/uL — ABNORMAL HIGH (ref 4.0–10.5)
nRBC: 0 % (ref 0.0–0.2)

## 2019-01-04 LAB — URINALYSIS, COMPLETE (UACMP) WITH MICROSCOPIC
Bacteria, UA: NONE SEEN
Bilirubin Urine: NEGATIVE
Glucose, UA: NEGATIVE mg/dL
Hgb urine dipstick: NEGATIVE
Ketones, ur: NEGATIVE mg/dL
Leukocytes,Ua: NEGATIVE
Nitrite: NEGATIVE
Protein, ur: NEGATIVE mg/dL
Specific Gravity, Urine: 1.015 (ref 1.005–1.030)
pH: 5 (ref 5.0–8.0)

## 2019-01-04 LAB — POC SARS CORONAVIRUS 2 AG: SARS Coronavirus 2 Ag: NEGATIVE

## 2019-01-04 LAB — LACTIC ACID, PLASMA
Lactic Acid, Venous: 2 mmol/L (ref 0.5–1.9)
Lactic Acid, Venous: 1.1 mmol/L (ref 0.5–1.9)

## 2019-01-04 LAB — PROTIME-INR
INR: 1.1 (ref 0.8–1.2)
Prothrombin Time: 14.3 seconds (ref 11.4–15.2)

## 2019-01-04 LAB — APTT: aPTT: 31 seconds (ref 24–36)

## 2019-01-04 MED ORDER — VANCOMYCIN HCL IN DEXTROSE 1-5 GM/200ML-% IV SOLN
1000.0000 mg | Freq: Once | INTRAVENOUS | Status: AC
  Administered 2019-01-04: 1000 mg via INTRAVENOUS
  Filled 2019-01-04: qty 200

## 2019-01-04 MED ORDER — VANCOMYCIN HCL 10 G IV SOLR
1500.0000 mg | Freq: Once | INTRAVENOUS | Status: DC
  Filled 2019-01-04: qty 1500

## 2019-01-04 MED ORDER — SODIUM CHLORIDE 0.9 % IV SOLN
2.0000 g | INTRAVENOUS | Status: DC
  Administered 2019-01-05: 2 g via INTRAVENOUS
  Filled 2019-01-04: qty 2
  Filled 2019-01-04: qty 20

## 2019-01-04 MED ORDER — SODIUM CHLORIDE 0.9% FLUSH
3.0000 mL | Freq: Once | INTRAVENOUS | Status: AC
  Administered 2019-01-04: 3 mL via INTRAVENOUS

## 2019-01-04 MED ORDER — SODIUM CHLORIDE 0.9 % IV SOLN
2.0000 g | Freq: Once | INTRAVENOUS | Status: AC
  Administered 2019-01-04: 2 g via INTRAVENOUS
  Filled 2019-01-04: qty 20

## 2019-01-04 MED ORDER — VANCOMYCIN HCL 1.5 G IV SOLR
1500.0000 mg | Freq: Once | INTRAVENOUS | Status: AC
  Administered 2019-01-04: 1500 mg via INTRAVENOUS
  Filled 2019-01-04: qty 1500

## 2019-01-04 MED ORDER — ACETAMINOPHEN 500 MG PO TABS
ORAL_TABLET | ORAL | Status: AC
  Filled 2019-01-04: qty 2

## 2019-01-04 MED ORDER — LACTATED RINGERS IV BOLUS
1000.0000 mL | Freq: Once | INTRAVENOUS | Status: AC
  Administered 2019-01-04: 1000 mL via INTRAVENOUS

## 2019-01-04 MED ORDER — ACETAMINOPHEN 500 MG PO TABS
1000.0000 mg | ORAL_TABLET | Freq: Once | ORAL | Status: AC
  Administered 2019-01-04: 1000 mg via ORAL

## 2019-01-04 MED ORDER — ENOXAPARIN SODIUM 40 MG/0.4ML ~~LOC~~ SOLN
40.0000 mg | Freq: Two times a day (BID) | SUBCUTANEOUS | Status: DC
  Filled 2019-01-04 (×3): qty 0.4

## 2019-01-04 NOTE — Progress Notes (Signed)
CODE SEPSIS - PHARMACY COMMUNICATION  **Broad Spectrum Antibiotics should be administered within 1 hour of Sepsis diagnosis**  Time Code Sepsis Called/Page Received: [id number]  Antibiotics Ordered: ceftriaxone/vancomycin  Time of 1st antibiotic administration: 1830  Additional action taken by pharmacy: n/a  If necessary, Name of Provider/Nurse Contacted: n/a    Rocky Morel ,PharmD Clinical Pharmacist  01/04/2019  5:55 PM

## 2019-01-04 NOTE — Progress Notes (Signed)
PHARMACY -  BRIEF ANTIBIOTIC NOTE   Pharmacy has received consult(s) for vancomycin from an ED provider.  The patient's profile has been reviewed for ht/wt/allergies/indication/available labs.    One time order(s) placed for vancomycin 1 g IV x1 by EDP. Will order another vancomycin 1500 mg IV x1 for total loading dose of 2500 mg.   Further antibiotics/pharmacy consults should be ordered by admitting physician if indicated.                       Thank you, Rocky Morel 01/04/2019  6:02 PM

## 2019-01-04 NOTE — ED Notes (Signed)
Pt placed into hospital bed and suction catheter placed on pt for comfort

## 2019-01-04 NOTE — ED Triage Notes (Signed)
Pt c/o cough, fever and vomiting since yesterday, pt also c/o having cellulitis, redness of the left abd/hip area with an open wound , states she noticed it being red today. States she has intermittent issues with it.

## 2019-01-04 NOTE — ED Notes (Signed)
Pt given [place] tray sandwich and shasta cola to drink

## 2019-01-04 NOTE — ED Notes (Signed)
nsecond set of cultures  Collected after 41ml waste

## 2019-01-04 NOTE — H&P (Signed)
History and Physical    Laura Nolan [id number] DOB: 06/30/1970 DOA: 01/04/2019  PCP: Sharyne Peach, MD  Patient coming from: Home  I have personally briefly reviewed patient's old medical records in [place]  Chief Complaint: fever, nausea and vomiting  HPI: Laura Nolan is a 48 y.o. female with medical history significant of morbid obesity, hypertension, chronic kidney disease stage III, endometrial cancer, GERD who presents with symptoms of fever, nausea or vomiting.    Patient states that last night she woke up feeling cold and then this morning noted that she had a fever and nausea and vomiting.  Also endorses a cough for the past few days but denies any chest pain or shortness of breath.   She has a wound to her left buttocks that she has been dealing with on and off for the past year.  She last saw wound clinic a year ago but has continued follow-up with her primary physician.  She reports that several weeks ago she was treated with antibiotics for cellulitis. Notes to have been placed on Clindamycin.  Also noted blister and drainage at that tiime.  She otherwise does her own wound care at home.  Today she notices that the wound has significant redness.  Denies tobacco, or illicit drug use.  Occasional alcohol use.  ED Course: She was febrile up to 101.5 and was hypertensive up to 187 over 80s on room air.  CBC shows leukocytosis of 11.4 and no anemia.  BMP showed glucose of 113, creatinine of 1.15 with unclear baseline.Lactic acid of 2.  CXR-ray is negative.  Review of Systems:  Constitutional: No Weight Change, + Fever ENT/Mouth: No sore throat, No Rhinorrhea Eyes: No Eye Pain, No Vision Changes Cardiovascular: No Chest Pain, no SOB Respiratory: + Cough, No Sputum, No Wheezing, no Dyspnea  Gastrointestinal: + Nausea, + Vomiting, No Diarrhea, No Constipation, No Pain Genitourinary: no Urinary Incontinence, No Urgency,  Musculoskeletal: No Arthralgias, No Myalgias  Skin: + Skin Lesions, No Pruritus, Neuro: no Weakness, No Numbness,   Psych: No Anxiety/Panic, No Depression, no decrease appetite Heme/Lymph: No Bruising, No Bleeding  Past Medical History:  Diagnosis Date  . Arthralgia   . Chronic renal disease, stage 3, moderately decreased glomerular filtration rate (GFR) between 30-59 mL/min/1.73 square meter   . Depression   . Elevated LFTs   . Endometrial cancer ([place])   . Endometrial cancer ([place])   . GERD (gastroesophageal reflux disease)   . Hypertension   . Morbid obesity ([place])   . Neuropathy due to chemotherapeutic drug ([place])   . Thyroid disease   . TMJ (dislocation of temporomandibular joint)   . Vitamin B12 deficiency   . Vitamin D deficiency     Past Surgical History:  Procedure Laterality Date  . CHOLECYSTECTOMY    . FRACTURE SURGERY    . NEPHRECTOMY Right      reports that she has never smoked. She has never used smokeless tobacco. She reports current alcohol use. No history on file for drug.  Allergies  Allergen Reactions  . Lisinopril Cough  . Macrodantin [Nitrofurantoin Macrocrystal] Itching  . Sulfa Antibiotics Itching and Swelling    Family History  Problem Relation Age of Onset  . Hypertension Mother   . Diabetes Father   . Hypertension Father      Prior to Admission medications   Medication Sig Start Date End Date Taking? Authorizing Provider  acetaminophen (TYLENOL) 500 MG tablet Take 1,000 mg by mouth every 6 (six)  hours as needed for mild pain.    [provider]  albuterol (PROVENTIL HFA;VENTOLIN HFA) 108 (90 Base) MCG/ACT inhaler Inhale 2 puffs into the lungs every 6 (six) hours as needed for wheezing or shortness of breath.    [provider]  buPROPion (WELLBUTRIN XL) 150 MG 24 hr tablet Take 150 mg by mouth 2 (two) times daily.    [provider]  diphenhydrAMINE (BENADRYL) 25 MG tablet Take 25 mg by mouth every 6 (six) hours as needed for allergies.    [provider]  ergocalciferol (VITAMIN D2) 50000 units capsule Take 50,000 Units by mouth once a week. Patient takes on Wednesday.    [provider]  levothyroxine (SYNTHROID, LEVOTHROID) 75 MCG tablet Take 75 mcg by mouth daily before breakfast.    [provider]  losartan (COZAAR) 50 MG tablet Take 50 mg by mouth daily.    [provider]    Physical Exam: Vitals:   01/04/19 1341 01/04/19 1342 01/04/19 1628  BP: (!) 187/87  (!) 144/77  Pulse: 100  87  Resp: 17  18  Temp: (!) 101.5 F (38.6 C)    TempSrc: Oral    SpO2: 97%  98%  Weight:  (!) 226.8 kg   Height:  5\' 6"  (1.676 m)     Constitutional: NAD, calm, comfortable, morbidly obese female sitting upright in bed Vitals:   01/04/19 1341 01/04/19 1342 01/04/19 1628  BP: (!) 187/87  (!) 144/77  Pulse: 100  87  Resp: 17  18  Temp: (!) 101.5 F (38.6 C)    TempSrc: Oral    SpO2: 97%  98%  Weight:  (!) 226.8 kg   Height:  5\' 6"  (1.676 m)    Eyes: PERRL, lids and conjunctivae normal ENMT: Mucous membranes are moist. Posterior pharynx clear of any exudate or lesions.Normal dentition.  Neck: normal, supple, no masses Respiratory: clear to auscultation bilaterally, no wheezing, no crackles. Normal respiratory effort. No accessory muscle use.  Cardiovascular: Regular rate and rhythm, no murmurs / rubs / gallops. No obvious extremity edema but difficult to assess due to body habitus. Abdomen: no tenderness,  Bowel sounds positive.  Musculoskeletal: no clubbing / cyanosis. No joint deformity upper and lower extremities.  Skin: Small 3 cm superficial wound with irregular borders on left buttocks with large surrounding area of erythema that extends across her left buttocks upper lateral anterior thigh into the abdominal fold.  Also has increased warmth to palpation.  No noted purulence or bloody drainage. Neurologic: CN 2-12 grossly intact. Sensation intact. Difficult to access lower extremity strength due to body  habitus. Psychiatric: Normal judgment and insight. Alert and oriented x 3. Normal mood.     Labs on Admission: I have personally reviewed following labs and imaging studies  CBC: Recent Labs  Lab 01/04/19 1401  WBC 11.4*  NEUTROABS 10.2*  HGB 13.6  HCT 41.4  MCV 85.7  PLT [id number]   Basic Metabolic Panel: Recent Labs  Lab 01/04/19 1401  NA 137  K 4.0  CL 102  CO2 24  GLUCOSE 113*  BUN 14  CREATININE 1.15*  CALCIUM 8.8*   GFR: Estimated Creatinine Clearance: 120.6 mL/min (A) (by C-G formula based on SCr of 1.15 mg/dL (H)). Liver Function Tests: Recent Labs  Lab 01/04/19 1401  AST 15  ALT 12  ALKPHOS 66  BILITOT 1.5*  PROT 7.7  ALBUMIN 3.7   No results for input(s): LIPASE, AMYLASE in the last 168 hours.  No results for input(s): AMMONIA in the last 168 hours. Coagulation Profile: Recent Labs  Lab 01/04/19 1753  INR 1.1   Cardiac Enzymes: No results for input(s): CKTOTAL, CKMB, CKMBINDEX, TROPONINI in the last 168 hours. BNP (last 3 results) No results for input(s): PROBNP in the last 8760 hours. HbA1C: No results for input(s): HGBA1C in the last 72 hours. CBG: No results for input(s): GLUCAP in the last 168 hours. Lipid Profile: No results for input(s): CHOL, HDL, LDLCALC, TRIG, CHOLHDL, LDLDIRECT in the last 72 hours. Thyroid Function Tests: No results for input(s): TSH, T4TOTAL, FREET4, T3FREE, THYROIDAB in the last 72 hours. Anemia Panel: No results for input(s): VITAMINB12, FOLATE, FERRITIN, TIBC, IRON, RETICCTPCT in the last 72 hours. Urine analysis:    Component Value Date/Time   COLORURINE YELLOW (A) 06/29/2018 1244   APPEARANCEUR CLOUDY (A) 06/29/2018 1244   LABSPEC 1.021 06/29/2018 1244   PHURINE 6.0 06/29/2018 [place] 06/29/2018 [place] NEGATIVE 06/29/2018 [place] NEGATIVE 06/29/2018 [place] 06/29/2018 1244   PROTEINUR 30 (A) 06/29/2018 1244   NITRITE NEGATIVE 06/29/2018 1244    LEUKOCYTESUR NEGATIVE 06/29/2018 1244    Radiological Exams on Admission: Dg Chest 2 View  Result Date: 01/04/2019 CLINICAL DATA:  Fever and cough. EXAM: CHEST - 2 VIEW COMPARISON:  July 02, 2018 FINDINGS: Minimal atelectasis in the left base. The heart, hila, mediastinum, lungs, and pleura are otherwise normal. IMPRESSION: No active cardiopulmonary disease. Electronically Signed   By: Dorise Bullion III M.D   On: 01/04/2019 17:48    EKG: Independently reviewed.   Assessment/Plan  sepsis secondary to left buttock celluitis  - started on Vanc and rocephin in ED. Will continue Rocephin given no abscess -Wound care per RN. Place skin marker around erythema to monitor any improvement  Endometrial cancer - reportedly has not followed up with Gyn Onc  CKD stage 3 - stable - avoid nephrotoxic agents  Hypertension -had Losartan but note on med rec to not be taking any medication  Morbid obesity - BMI >80. Complicating wound healing.  DVT prophylaxis:.Lovenox Code Status:Full Family Communication: Plan discussed with patient at bedside  disposition Plan: Home with at least 2 midnight stays  Consults called:  Admission status: inpatient  Casimer Russett T Willys Salvino DO [facility]   If 7PM-7AM, please contact night-coverage [email or web address] Password [facility]  01/04/2019, 7:44 PM

## 2019-01-04 NOTE — ED Provider Notes (Signed)
[facility] Emergency Department Provider Note   ____________________________________________   First MD Initiated Contact with Patient 01/04/19 1741     (approximate)  I have reviewed the triage vital signs and the nursing notes.   HISTORY  Chief Complaint Cellulitis    HPI Laura Nolan is a 48 y.o. female with past medical history of morbid obesity, hypertension, CKD, and endometrial cancer who presents to the ED complaining of cellulitis.  Patient reports that she noticed increased pain to the area of her left buttock over the past 2 days with increasing redness over her this area earlier today.  She noticed a fever starting yesterday, which reached as high as 1023 earlier today.  She is concerned for infection and cellulitis to this area as she has been dealing with on and off infections there over multiple years.  She states she was recently prescribed a course of clindamycin for possible infection 2 weeks ago, but did not complete the whole course of antibiotics.  She also complains of a cough, but denies any chest pain or shortness of breath.  She does state that 2 coworkers tested positive for COVID-19, although she was not in close contact with them.  She complains of some dysuria, but denies any hematuria or flank pain.  She has not had any abdominal pain.        Past Medical History:  Diagnosis Date  . Arthralgia   . Chronic renal disease, stage 3, moderately decreased glomerular filtration rate (GFR) between 30-59 mL/min/1.73 square meter   . Depression   . Elevated LFTs   . Endometrial cancer ([place])   . Endometrial cancer ([place])   . GERD (gastroesophageal reflux disease)   . Hypertension   . Morbid obesity ([place])   . Neuropathy due to chemotherapeutic drug ([place])   . Thyroid disease   . TMJ (dislocation of temporomandibular joint)   . Vitamin B12 deficiency   . Vitamin D deficiency     Patient Active Problem List   Diagnosis Date Noted  .  Pressure injury of skin 12/19/2016  . Cellulitis of left buttock 12/17/2016  . Tonsillar abscess 11/11/2015    Past Surgical History:  Procedure Laterality Date  . CHOLECYSTECTOMY    . FRACTURE SURGERY    . NEPHRECTOMY Right     Prior to Admission medications   Medication Sig Start Date End Date Taking? Authorizing Provider  acetaminophen (TYLENOL) 500 MG tablet Take 1,000 mg by mouth every 6 (six) hours as needed for mild pain.    [provider]  albuterol (PROVENTIL HFA;VENTOLIN HFA) 108 (90 Base) MCG/ACT inhaler Inhale 2 puffs into the lungs every 6 (six) hours as needed for wheezing or shortness of breath.    [provider]  buPROPion (WELLBUTRIN XL) 150 MG 24 hr tablet Take 150 mg by mouth 2 (two) times daily.    [provider]  diphenhydrAMINE (BENADRYL) 25 MG tablet Take 25 mg by mouth every 6 (six) hours as needed for allergies.    [provider]  ergocalciferol (VITAMIN D2) 50000 units capsule Take 50,000 Units by mouth once a week. Patient takes on Wednesday.    [provider]  levothyroxine (SYNTHROID, LEVOTHROID) 75 MCG tablet Take 75 mcg by mouth daily before breakfast.    [provider]  losartan (COZAAR) 50 MG tablet Take 50 mg by mouth daily.    [provider]    Allergies Lisinopril, Macrodantin [nitrofurantoin macrocrystal], and Sulfa antibiotics  Family History  Problem Relation Age of Onset  . Hypertension Mother   . Diabetes Father   . Hypertension Father     Social History Social History   Tobacco Use  . Smoking status: Never Smoker  . Smokeless tobacco: Never Used  Substance Use Topics  . Alcohol use: Yes  . Drug use: Not on file    Review of Systems  Constitutional: Positive for fever/chills Eyes: No visual changes. ENT: No sore throat. Cardiovascular: Denies chest pain. Respiratory: Denies shortness of breath.  Positive for cough Gastrointestinal: No abdominal pain.  No  nausea, no vomiting.  No diarrhea.  No constipation. Genitourinary: Negative for dysuria. Musculoskeletal: Negative for back pain. Skin: Negative for rash.  Positive for left gluteal wound. Neurological: Negative for headaches, focal weakness or numbness.  ____________________________________________   PHYSICAL EXAM:  VITAL SIGNS: ED Triage Vitals  Enc Vitals Group     BP 01/04/19 1341 (!) 187/87     Pulse Rate 01/04/19 1341 100     Resp 01/04/19 1341 17     Temp 01/04/19 1341 (!) 101.5 F (38.6 C)     Temp Source 01/04/19 1341 Oral     SpO2 01/04/19 1341 97 %     Weight 01/04/19 1342 (!) 500 lb (226.8 kg)     Height 01/04/19 1342 5\' 6"  (1.676 m)     Head Circumference --      Peak Flow --      Pain Score 01/04/19 1342 0     Pain Loc --      Pain Edu? --      Excl. in [place]? --     Constitutional: Alert and oriented.  Morbidly obese. Eyes: Conjunctivae are normal. Head: Atraumatic. Nose: No congestion/rhinnorhea. Mouth/Throat: Mucous membranes are moist. Neck: Normal ROM Cardiovascular: Tachycardic, regular rhythm. Grossly normal heart sounds. Respiratory: Normal respiratory effort.  No retractions. Lungs CTAB. Gastrointestinal: Soft and nontender. No distention. Genitourinary: deferred Musculoskeletal: No lower extremity tenderness nor edema. Neurologic:  Normal speech and language. No gross focal neurologic deficits are appreciated. Skin: Large area of cellulitis noted to left buttock with central pressure injury but no focal fluctuance. Psychiatric: Mood and affect are normal. Speech and behavior are normal.  ____________________________________________   LABS (all labs ordered are listed, but only abnormal results are displayed)  Labs Reviewed  LACTIC ACID, PLASMA - Abnormal; Notable for the following components:      Result Value   Lactic Acid, Venous 2.0 (*)    All other components within normal limits  COMPREHENSIVE METABOLIC PANEL - Abnormal; Notable for  the following components:   Glucose, Bld 113 (*)    Creatinine, Ser 1.15 (*)    Calcium 8.8 (*)    Total Bilirubin 1.5 (*)    GFR calc non Af Amer 57 (*)    All other components within normal limits  CBC WITH DIFFERENTIAL/PLATELET - Abnormal; Notable for the following components:   WBC 11.4 (*)    Neutro Abs 10.2 (*)    All other components within normal limits  CULTURE, BLOOD (ROUTINE X 2)  CULTURE, BLOOD (ROUTINE X 2)  URINE CULTURE  SARS CORONAVIRUS 2 (TAT 6-24 HRS)  APTT  PROTIME-INR  LACTIC ACID, PLASMA  URINALYSIS, COMPLETE (UACMP) WITH MICROSCOPIC  POC SARS CORONAVIRUS 2 AG  POC URINE PREG, ED  POC SARS CORONAVIRUS 2 AG -  ED   ____________________________________________  EKG  ED ECG REPORT I, Blake Divine, the attending physician, personally viewed and interpreted this ECG.  Date: 01/04/2019  EKG Time: 18:15  Rate: 83  Rhythm: normal EKG, normal sinus rhythm, unchanged from previous tracings  Axis: Normal  Intervals:none  ST&T Change: None   PROCEDURES  Procedure(s) performed (including Critical Care):  .Critical Care Performed by: Blake Divine, MD Authorized by: Blake Divine, MD   Critical care provider statement:    Critical care time (minutes):  45   Critical care time was exclusive of:  Separately billable procedures and treating other patients and teaching time   Critical care was necessary to treat or prevent imminent or life-threatening deterioration of the following conditions:  Sepsis   Critical care was time spent personally by me on the following activities:  Discussions with consultants, evaluation of patient's response to treatment, examination of patient, ordering and performing treatments and interventions, ordering and review of laboratory studies, ordering and review of radiographic studies, pulse oximetry, re-evaluation of patient's condition, obtaining history from patient or surrogate and review of old charts   I assumed  direction of critical care for this patient from another provider in my specialty: no       ____________________________________________   INITIAL IMPRESSION / [place] / ED COURSE       48 year old female presents to the ED with increased redness and pain to chronic wound near left gluteal area, developed a fever earlier today.  Vital signs are consistent with sepsis given her fever and tachycardia, sepsis work-up initiated with blood cultures and antibiotics as well as fluid bolus.  She has obvious cellulitis to her left gluteal area with some purulent drainage near the middle with associated pressure injury but no focal abscess requiring drainage. She does complain of a cough, but chest x-ray is negative for acute process, COVID-19 testing pending.  UA is also pending.  She has a mild lactic acidosis, will recheck, but remainder of labs are unremarkable.  Chest x-ray negative for acute process, it appears that cellulitis is most likely source of her sepsis.  Heart rate improved following IV fluid bolus and she remains normotensive.  Labs unremarkable outside of mild lactic acidosis.  Case discussed with hospitalist, who accepts patient for admission.      ____________________________________________   FINAL CLINICAL IMPRESSION(S) / ED DIAGNOSES  Final diagnoses:  Sepsis without acute organ dysfunction, due to unspecified organism ([place])  Cellulitis of buttock  Morbid obesity [facility])     ED Discharge Orders    None       Note:  This document was prepared using Dragon voice recognition software and may include unintentional dictation errors.   Blake Divine, MD 01/04/19 [id number]

## 2019-01-04 NOTE — ED Triage Notes (Signed)
Pt denies worsening symptoms. Breathing equal and unlabored in [place]. Will continue to asses

## 2019-01-04 NOTE — Progress Notes (Signed)
PHARMACIST - PHYSICIAN COMMUNICATION  CONCERNING:  Enoxaparin (Lovenox) for DVT Prophylaxis   RECOMMENDATION: Patient was prescribed enoxaprin 40mg  q24 hours for VTE prophylaxis.   Filed Weights   01/04/19 1342  Weight: (!) 500 lb (226.8 kg)    Body mass index is 80.7 kg/m.  Estimated Creatinine Clearance: 120.6 mL/min (A) (by C-G formula based on SCr of 1.15 mg/dL (H)).  Based on [facility] patient is candidate for enoxaparin 40mg  every 12 hour dosing due to BMI being >40.  DESCRIPTION: Pharmacy has adjusted enoxaparin dose per [facility] policy.  Patient is now receiving enoxaparin 40mg  every 12 hours.   Ena Dawley, PharmD Clinical Pharmacist  01/04/2019 10:41 PM

## 2019-01-05 ENCOUNTER — Other Ambulatory Visit: Payer: Self-pay

## 2019-01-05 DIAGNOSIS — Z6841 Body Mass Index (BMI) 40.0 and over, adult: Secondary | ICD-10-CM | POA: Diagnosis not present

## 2019-01-05 DIAGNOSIS — A419 Sepsis, unspecified organism: Secondary | ICD-10-CM | POA: Diagnosis not present

## 2019-01-05 DIAGNOSIS — N183 Chronic kidney disease, stage 3 unspecified: Secondary | ICD-10-CM | POA: Diagnosis not present

## 2019-01-05 DIAGNOSIS — L03317 Cellulitis of buttock: Secondary | ICD-10-CM | POA: Diagnosis not present

## 2019-01-05 DIAGNOSIS — I10 Essential (primary) hypertension: Secondary | ICD-10-CM

## 2019-01-05 LAB — CBC
HCT: 38.9 % (ref 36.0–46.0)
Hemoglobin: 12.3 g/dL (ref 12.0–15.0)
MCH: 28.1 pg (ref 26.0–34.0)
MCHC: 31.6 g/dL (ref 30.0–36.0)
MCV: 89 fL (ref 80.0–100.0)
Platelets: 260 10*3/uL (ref 150–400)
RBC: 4.37 MIL/uL (ref 3.87–5.11)
RDW: 15 % (ref 11.5–15.5)
WBC: 6.4 10*3/uL (ref 4.0–10.5)
nRBC: 0 % (ref 0.0–0.2)

## 2019-01-05 LAB — BASIC METABOLIC PANEL
Anion gap: 9 (ref 5–15)
BUN: 13 mg/dL (ref 6–20)
CO2: 25 mmol/L (ref 22–32)
Calcium: 8.4 mg/dL — ABNORMAL LOW (ref 8.9–10.3)
Chloride: 104 mmol/L (ref 98–111)
Creatinine, Ser: 1.08 mg/dL — ABNORMAL HIGH (ref 0.44–1.00)
GFR calc Af Amer: >60 mL/min (ref >60)
GFR calc non Af Amer: >60 mL/min (ref >60)
Glucose, Bld: 104 mg/dL — ABNORMAL HIGH (ref 70–99)
Potassium: 3.8 mmol/L (ref 3.5–5.1)
Sodium: 138 mmol/L (ref 135–145)

## 2019-01-05 LAB — URINE CULTURE

## 2019-01-05 LAB — HIV ANTIBODY (ROUTINE TESTING W REFLEX): HIV Screen 4th Generation wRfx: NONREACTIVE

## 2019-01-05 LAB — SARS CORONAVIRUS 2 (TAT 6-24 HRS): SARS Coronavirus 2: NEGATIVE

## 2019-01-05 MED ORDER — SODIUM CHLORIDE 0.9 % IV SOLN
INTRAVENOUS | Status: DC
  Administered 2019-01-05 – 2019-01-06 (×3): via INTRAVENOUS

## 2019-01-05 MED ORDER — ACETAMINOPHEN 325 MG PO TABS
650.0000 mg | ORAL_TABLET | Freq: Four times a day (QID) | ORAL | Status: DC | PRN
  Administered 2019-01-05: 650 mg via ORAL
  Filled 2019-01-05: qty 2

## 2019-01-05 NOTE — ED Notes (Signed)
Laura Nolan, EDT and this tech into change pts bed linen d/t pt being wet. Pt able to stand with cane in one position; pt able to get back in bed. Pure Wick placed back in place and cardiac monitoring back on pt. No other needs voiced at this time.

## 2019-01-05 NOTE — Progress Notes (Signed)
PROGRESS NOTE    Laura Nolan  [id number] DOB: [birth year] DOA: 01/04/2019 PCP: Sharyne Peach, MD    Brief Narrative:  48 year old female with a history of morbid obesity, hypertension, chronic kidney disease stage III, presents to the hospital with fever, nausea vomiting.  She will also have worsening erythema of her left buttocks.  She has a chronic wound in this area which was noted to have worsening erythema.  She is admitted for intravenous antibiotics.   Assessment & Plan:   Active Problems:   Cellulitis   Essential hypertension   CKD (chronic kidney disease), stage III   BMI 70 and over, adult ([place])   1. Sepsis secondary to left buttock cellulitis.  Currently on intravenous Rocephin.  No evidence of abscess.  Wound on left buttocks has wound care per RN.  Overall erythema mildly improving, but would benefit from the 24 hours.  She had a fever last night, monitor for continued fever. 2. Endometrial cancer.  Follow-up with GYN oncology 3. Chronic kidney disease stage III.  Currently stable.  Continue to follow. 4. Hypertension.  Blood pressures currently stable 5. Morbid obesity.  Complicating cellulitis/lymphedema.   DVT prophylaxis: lovenox Code Status: full code Family Communication: none present Disposition Plan: discharge home in next 24 hours if cellulitis continues to improve   Consultants:     Procedures:     Antimicrobials:   ceftriazone 12/8>   Subjective: Feels that erythema is mildly better today than yesterday but not back to baseline. Still has some soreness in left buttocks.  Objective: Vitals:   01/05/19 0614 01/05/19 0900 01/05/19 1454 01/05/19 1649  BP: 136/62 138/79 132/75 128/70  Pulse: 84 86 77 77  Resp: 18 18 18 18   Temp: 98.8 F (37.1 C) 98.3 F (36.8 C) 98.4 F (36.9 C) 98.3 F (36.8 C)  TempSrc: Oral Oral Oral Oral  SpO2: 98% 95% 99% 100%  Weight:      Height:        Intake/Output Summary (Last 24 hours) at 01/05/2019  1711 Last data filed at 01/05/2019 1457 Gross per 24 hour  Intake 1743 ml  Output 800 ml  Net 943 ml   Filed Weights   01/04/19 1342  Weight: (!) 226.8 kg    Examination:  General exam: Appears calm and comfortable  Respiratory system: Clear to auscultation. Respiratory effort normal. Cardiovascular system: S1 & S2 heard, RRR. No JVD, murmurs, rubs, gallops or clicks. No pedal edema. Gastrointestinal system: Abdomen is nondistended, soft and nontender. No organomegaly or masses felt. Normal bowel sounds heard. Central nervous system: Alert and oriented. No focal neurological deficits. Extremities: Symmetric 5 x 5 power., Left lateral thigh and left buttocks with lymphedema, erythema and warmth Skin: No rashes, lesions or ulcers Psychiatry: Judgement and insight appear normal. Mood & affect appropriate.     Data Reviewed: I have personally reviewed following labs and imaging studies  CBC: Recent Labs  Lab 01/04/19 1401 01/05/19 0616  WBC 11.4* 6.4  NEUTROABS 10.2*  --   HGB 13.6 12.3  HCT 41.4 38.9  MCV 85.7 89.0  PLT 295 [id number]   Basic Metabolic Panel: Recent Labs  Lab 01/04/19 1401 01/05/19 0616  NA 137 138  K 4.0 3.8  CL 102 104  CO2 24 25  GLUCOSE 113* 104*  BUN 14 13  CREATININE 1.15* 1.08*  CALCIUM 8.8* 8.4*   GFR: Estimated Creatinine Clearance: 128.4 mL/min (A) (by C-G formula based on SCr of 1.08 mg/dL (H)). Liver Function Tests:  Recent Labs  Lab 01/04/19 1401  AST 15  ALT 12  ALKPHOS 66  BILITOT 1.5*  PROT 7.7  ALBUMIN 3.7   No results for input(s): LIPASE, AMYLASE in the last 168 hours. No results for input(s): AMMONIA in the last 168 hours. Coagulation Profile: Recent Labs  Lab 01/04/19 1753  INR 1.1   Cardiac Enzymes: No results for input(s): CKTOTAL, CKMB, CKMBINDEX, TROPONINI in the last 168 hours. BNP (last 3 results) No results for input(s): PROBNP in the last 8760 hours. HbA1C: No results for input(s): HGBA1C in the last 72  hours. CBG: No results for input(s): GLUCAP in the last 168 hours. Lipid Profile: No results for input(s): CHOL, HDL, LDLCALC, TRIG, CHOLHDL, LDLDIRECT in the last 72 hours. Thyroid Function Tests: No results for input(s): TSH, T4TOTAL, FREET4, T3FREE, THYROIDAB in the last 72 hours. Anemia Panel: No results for input(s): VITAMINB12, FOLATE, FERRITIN, TIBC, IRON, RETICCTPCT in the last 72 hours. Sepsis Labs: Recent Labs  Lab 01/04/19 1401 01/04/19 1544  LATICACIDVEN 2.0* 1.1    Recent Results (from the past 240 hour(s))  Blood Culture (routine x 2)     Status: None (Preliminary result)   Collection Time: 01/04/19  2:01 PM   Specimen: BLOOD  Result Value Ref Range Status   Specimen Description BLOOD RIGHT ANTECUBITAL  Final   Special Requests   Final    BOTTLES DRAWN AEROBIC AND ANAEROBIC Blood Culture adequate volume   Culture   Final    NO GROWTH < 12 HOURS Performed at [facility], [address] [place], [place] [address]    Report Status PENDING  Incomplete  Blood Culture (routine x 2)     Status: None (Preliminary result)   Collection Time: 01/04/19  6:33 PM   Specimen: BLOOD  Result Value Ref Range Status   Specimen Description BLOOD RIGHT ANTECUBITAL  Final   Special Requests   Final    BOTTLES DRAWN AEROBIC AND ANAEROBIC Blood Culture adequate volume   Culture   Final    NO GROWTH < 12 HOURS Performed at [facility], [address] [place], [place] [address]    Report Status PENDING  Incomplete  SARS CORONAVIRUS 2 (TAT 6-24 HRS) Nasopharyngeal Nasopharyngeal Swab     Status: None   Collection Time: 01/04/19  7:15 PM   Specimen: Nasopharyngeal Swab  Result Value Ref Range Status   SARS Coronavirus 2 NEGATIVE NEGATIVE Final    Comment: (NOTE) SARS-CoV-2 target nucleic acids are NOT DETECTED. The SARS-CoV-2 RNA is generally detectable in upper and lower respiratory specimens during the acute phase of infection. Negative results do not  preclude SARS-CoV-2 infection, do not rule out co-infections with other pathogens, and should not be used as the sole basis for treatment or other patient management decisions. Negative results must be combined with clinical observations, patient history, and epidemiological information. The expected result is Negative. Fact Sheet for Patients: SugarRoll.be Fact Sheet for Healthcare Providers: https://www.woods-mathews.com/ This test is not yet approved or cleared by the [place] FDA and  has been authorized for detection and/or diagnosis of SARS-CoV-2 by FDA under an Emergency Use Authorization (EUA). This EUA will remain  in effect (meaning this test can be used) for the duration of the COVID-19 declaration under Section 56 4(b)(1) of the Act, 21 U.S.C. section 360bbb-3(b)(1), unless the authorization is terminated or revoked sooner. Performed at [facility], [place] [address], [place], [place] [address]  Radiology Studies: Dg Chest 2 View  Result Date: 01/04/2019 CLINICAL DATA:  Fever and cough. EXAM: CHEST - 2 VIEW COMPARISON:  July 02, 2018 FINDINGS: Minimal atelectasis in the left base. The heart, hila, mediastinum, lungs, and pleura are otherwise normal. IMPRESSION: No active cardiopulmonary disease. Electronically Signed   By: Dorise Bullion III M.D   On: 01/04/2019 17:48        Scheduled Meds: . enoxaparin (LOVENOX) injection  40 mg Subcutaneous Q12H   Continuous Infusions: . sodium chloride 100 mL/hr at 01/05/19 1438  . cefTRIAXone (ROCEPHIN)  IV       LOS: 1 day    Time spent: 48mins    Kathie Dike, MD [facility]   If 7PM-7AM, please contact night-coverage [email or web address]  01/05/2019, 5:11 PM

## 2019-01-06 DIAGNOSIS — L03317 Cellulitis of buttock: Secondary | ICD-10-CM | POA: Diagnosis not present

## 2019-01-06 DIAGNOSIS — I10 Essential (primary) hypertension: Secondary | ICD-10-CM | POA: Diagnosis not present

## 2019-01-06 DIAGNOSIS — N183 Chronic kidney disease, stage 3 unspecified: Secondary | ICD-10-CM | POA: Diagnosis not present

## 2019-01-06 DIAGNOSIS — Z6841 Body Mass Index (BMI) 40.0 and over, adult: Secondary | ICD-10-CM | POA: Diagnosis not present

## 2019-01-06 MED ORDER — DOXYCYCLINE HYCLATE 100 MG PO CAPS: 100.0000 mg | ORAL_CAPSULE | Freq: Two times a day (BID) | ORAL | 0 refills | Status: DC

## 2019-01-06 MED ORDER — AMOXICILLIN-POT CLAVULANATE 875-125 MG PO TABS: 1.0000 | ORAL_TABLET | Freq: Two times a day (BID) | ORAL | 0 refills | Status: AC

## 2019-01-06 NOTE — Discharge Summary (Signed)
Physician Discharge Summary  Laura Nolan [id number] DOB: [birth year] DOA: 01/04/2019  PCP: Sharyne Peach, MD  Admit date: 01/04/2019 Discharge date: 01/06/2019  Admitted From: home Disposition:  home  Recommendations for Outpatient Follow-up:  1. Follow up with PCP in 1-2 weeks 2. Please obtain BMP/CBC in one week   Discharge Condition:stable CODE STATUS:full code Diet recommendation: heart healthy  Brief/Interim Summary: 48 year old female with a history of morbid obesity, hypertension, chronic kidney disease stage III, presents to the hospital with fever, nausea vomiting.  She will also have worsening erythema of her left buttocks.  She has a chronic wound in this area which was noted to have worsening erythema.  She was admitted for intravenous antibiotics.  Discharge Diagnoses:  Active Problems:   Cellulitis   Essential hypertension   CKD (chronic kidney disease), stage III   BMI 70 and over, adult ([place])  1. Sepsis secondary to left buttock cellulitis.    Treated with intravenous Rocephin.  No evidence of abscess.  Wound on left buttocks has wound care per RN.    Overall erythema has improved.  She denied any further fevers.  Area was nontender.  Leukocytosis has resolved.  She will be transitioned to oral antibiotics to complete her course.. 2. Endometrial cancer.  Follow-up with GYN oncology 3. Chronic kidney disease stage III.  Currently stable.  Continue to follow. 4. Hypertension.  Blood pressures currently stable 5. Morbid obesity.  Complicating cellulitis/lymphedema  Discharge Instructions  Discharge Instructions    Diet - low sodium heart healthy   Complete by: As directed    Increase activity slowly   Complete by: As directed      Allergies as of 01/06/2019      Reactions   Lisinopril Cough   Macrodantin [nitrofurantoin Macrocrystal] Itching   Sulfa Antibiotics Itching, Swelling      Medication List    TAKE these medications   acetaminophen 500 MG  tablet Commonly known as: TYLENOL Take 1,000 mg by mouth every 6 (six) hours as needed for mild pain.   amoxicillin-clavulanate 875-125 MG tablet Commonly known as: Augmentin Take 1 tablet by mouth 2 (two) times daily for 5 days.   diphenhydrAMINE 25 MG tablet Commonly known as: BENADRYL Take 25 mg by mouth every 6 (six) hours as needed for allergies.   doxycycline 100 MG capsule Commonly known as: VIBRAMYCIN Take 1 capsule (100 mg total) by mouth 2 (two) times daily.       Allergies  Allergen Reactions  . Lisinopril Cough  . Macrodantin [Nitrofurantoin Macrocrystal] Itching  . Sulfa Antibiotics Itching and Swelling    Consultations:     Procedures/Studies: DG Chest 2 View  Result Date: 01/04/2019 CLINICAL DATA:  Fever and cough. EXAM: CHEST - 2 VIEW COMPARISON:  July 02, 2018 FINDINGS: Minimal atelectasis in the left base. The heart, hila, mediastinum, lungs, and pleura are otherwise normal. IMPRESSION: No active cardiopulmonary disease. Electronically Signed   By: Dorise Bullion III M.D   On: 01/04/2019 17:48       Subjective: Feeling better.  Overall erythema is better.  Her leg does not feel as warm.  It is not tender.  Discharge Exam: Vitals:   01/05/19 0900 01/05/19 1454 01/05/19 1649 01/06/19 0832  BP: 138/79 132/75 128/70 120/69  Pulse: 86 77 77 69  Resp: 18 18 18 18   Temp: 98.3 F (36.8 C) 98.4 F (36.9 C) 98.3 F (36.8 C) 98.2 F (36.8 C)  TempSrc: Oral Oral Oral   SpO2: 95%  99% 100% 100%  Weight:      Height:        General: Pt is alert, awake, not in acute distress Cardiovascular: RRR, S1/S2 +, no rubs, no gallops Respiratory: CTA bilaterally, no wheezing, no rhonchi Abdominal: Soft, NT, ND, bowel sounds + Extremities: Significant lymphedema of the left lower extremity.    The results of significant diagnostics from this hospitalization (including imaging, microbiology, ancillary and laboratory) are listed below for reference.      Microbiology: Recent Results (from the past 240 hour(s))  Blood Culture (routine x 2)     Status: None (Preliminary result)   Collection Time: 01/04/19  2:01 PM   Specimen: BLOOD  Result Value Ref Range Status   Specimen Description BLOOD RIGHT ANTECUBITAL  Final   Special Requests   Final    BOTTLES DRAWN AEROBIC AND ANAEROBIC Blood Culture adequate volume   Culture   Final    NO GROWTH 2 DAYS Performed at [facility], [address], [place], [place] [address]    Report Status PENDING  Incomplete  Urine culture     Status: Abnormal   Collection Time: 01/04/19  5:53 PM   Specimen: In/Out Cath Urine  Result Value Ref Range Status   Specimen Description   Final    IN/OUT CATH URINE Performed at [facility], [address], [place], [place] [address]    Special Requests   Final    NONE Performed at [facility], [address] [place], [place] [address]    Culture MULTIPLE SPECIES PRESENT, SUGGEST RECOLLECTION (A)  Final   Report Status 01/05/2019 FINAL  Final  Blood Culture (routine x 2)     Status: None (Preliminary result)   Collection Time: 01/04/19  6:33 PM   Specimen: BLOOD  Result Value Ref Range Status   Specimen Description BLOOD RIGHT ANTECUBITAL  Final   Special Requests   Final    BOTTLES DRAWN AEROBIC AND ANAEROBIC Blood Culture adequate volume   Culture   Final    NO GROWTH 2 DAYS Performed at [facility], [address] [place], [place] [address]    Report Status PENDING  Incomplete  SARS CORONAVIRUS 2 (TAT 6-24 HRS) Nasopharyngeal Nasopharyngeal Swab     Status: None   Collection Time: 01/04/19  7:15 PM   Specimen: Nasopharyngeal Swab  Result Value Ref Range Status   SARS Coronavirus 2 NEGATIVE NEGATIVE Final    Comment: (NOTE) SARS-CoV-2 target nucleic acids are NOT DETECTED. The SARS-CoV-2 RNA is generally detectable in upper and lower respiratory specimens during the acute phase of infection.  Negative results do not preclude SARS-CoV-2 infection, do not rule out co-infections with other pathogens, and should not be used as the sole basis for treatment or other patient management decisions. Negative results must be combined with clinical observations, patient history, and epidemiological information. The expected result is Negative. Fact Sheet for Patients: SugarRoll.be Fact Sheet for Healthcare Providers: https://www.woods-mathews.com/ This test is not yet approved or cleared by the [place] FDA and  has been authorized for detection and/or diagnosis of SARS-CoV-2 by FDA under an Emergency Use Authorization (EUA). This EUA will remain  in effect (meaning this test can be used) for the duration of the COVID-19 declaration under Section 56 4(b)(1) of the Act, 21 U.S.C. section 360bbb-3(b)(1), unless the authorization is terminated or revoked sooner. Performed at [facility], [place] [address] [place], [place] [address]      Labs:  BNP (last 3 results) Recent Labs    07/02/18 1336  BNP 99991111   Basic Metabolic Panel: Recent Labs  Lab 01/04/19 1401 01/05/19 0616  NA 137 138  K 4.0 3.8  CL 102 104  CO2 24 25  GLUCOSE 113* 104*  BUN 14 13  CREATININE 1.15* 1.08*  CALCIUM 8.8* 8.4*   Liver Function Tests: Recent Labs  Lab 01/04/19 1401  AST 15  ALT 12  ALKPHOS 66  BILITOT 1.5*  PROT 7.7  ALBUMIN 3.7   No results for input(s): LIPASE, AMYLASE in the last 168 hours. No results for input(s): AMMONIA in the last 168 hours. CBC: Recent Labs  Lab 01/04/19 1401 01/05/19 0616  WBC 11.4* 6.4  NEUTROABS 10.2*  --   HGB 13.6 12.3  HCT 41.4 38.9  MCV 85.7 89.0  PLT 295 260   Cardiac Enzymes: No results for input(s): CKTOTAL, CKMB, CKMBINDEX, TROPONINI in the last 168 hours. BNP: Invalid input(s): POCBNP CBG: No results for input(s): GLUCAP in the last 168 hours. D-Dimer No results for input(s): DDIMER  in the last 72 hours. Hgb A1c No results for input(s): HGBA1C in the last 72 hours. Lipid Profile No results for input(s): CHOL, HDL, LDLCALC, TRIG, CHOLHDL, LDLDIRECT in the last 72 hours. Thyroid function studies No results for input(s): TSH, T4TOTAL, T3FREE, THYROIDAB in the last 72 hours.  Invalid input(s): FREET3 Anemia work up No results for input(s): VITAMINB12, FOLATE, FERRITIN, TIBC, IRON, RETICCTPCT in the last 72 hours. Urinalysis    Component Value Date/Time   COLORURINE YELLOW (A) 01/04/2019 1344   APPEARANCEUR CLEAR (A) 01/04/2019 1344   LABSPEC 1.015 01/04/2019 1344   PHURINE 5.0 01/04/2019 1344   GLUCOSEU NEGATIVE 01/04/2019 1344   HGBUR NEGATIVE 01/04/2019 1344   BILIRUBINUR NEGATIVE 01/04/2019 1344   KETONESUR NEGATIVE 01/04/2019 1344   PROTEINUR NEGATIVE 01/04/2019 1344   NITRITE NEGATIVE 01/04/2019 1344   LEUKOCYTESUR NEGATIVE 01/04/2019 1344   Sepsis Labs Invalid input(s): PROCALCITONIN,  WBC,  LACTICIDVEN Microbiology Recent Results (from the past 240 hour(s))  Blood Culture (routine x 2)     Status: None (Preliminary result)   Collection Time: 01/04/19  2:01 PM   Specimen: BLOOD  Result Value Ref Range Status   Specimen Description BLOOD RIGHT ANTECUBITAL  Final   Special Requests   Final    BOTTLES DRAWN AEROBIC AND ANAEROBIC Blood Culture adequate volume   Culture   Final    NO GROWTH 2 DAYS Performed at [facility], [address] [place], [place] [address]    Report Status PENDING  Incomplete  Urine culture     Status: Abnormal   Collection Time: 01/04/19  5:53 PM   Specimen: In/Out Cath Urine  Result Value Ref Range Status   Specimen Description   Final    IN/OUT CATH URINE Performed at [facility], [address], [place], [place] [address]    Special Requests   Final    NONE Performed at [facility], [address] [place],  [address]    Culture MULTIPLE SPECIES PRESENT, SUGGEST RECOLLECTION  (A)  Final   Report Status 01/05/2019 FINAL  Final  Blood Culture (routine x 2)     Status: None (Preliminary result)   Collection Time: 01/04/19  6:33 PM   Specimen: BLOOD  Result Value Ref Range Status   Specimen Description BLOOD RIGHT ANTECUBITAL  Final   Special Requests   Final    BOTTLES DRAWN AEROBIC AND ANAEROBIC Blood  Culture adequate volume   Culture   Final    NO GROWTH 2 DAYS Performed at [facility], [place], [place] [address]    Report Status PENDING  Incomplete  SARS CORONAVIRUS 2 (TAT 6-24 HRS) Nasopharyngeal Nasopharyngeal Swab     Status: None   Collection Time: 01/04/19  7:15 PM   Specimen: Nasopharyngeal Swab  Result Value Ref Range Status   SARS Coronavirus 2 NEGATIVE NEGATIVE Final    Comment: (NOTE) SARS-CoV-2 target nucleic acids are NOT DETECTED. The SARS-CoV-2 RNA is generally detectable in upper and lower respiratory specimens during the acute phase of infection. Negative results do not preclude SARS-CoV-2 infection, do not rule out co-infections with other pathogens, and should not be used as the sole basis for treatment or other patient management decisions. Negative results must be combined with clinical observations, patient history, and epidemiological information. The expected result is Negative. Fact Sheet for Patients: SugarRoll.be Fact Sheet for Healthcare Providers: https://www.woods-mathews.com/ This test is not yet approved or cleared by the [place] FDA and  has been authorized for detection and/or diagnosis of SARS-CoV-2 by FDA under an Emergency Use Authorization (EUA). This EUA will remain  in effect (meaning this test can be used) for the duration of the COVID-19 declaration under Section 56 4(b)(1) of the Act, 21 U.S.C. section 360bbb-3(b)(1), unless the authorization is terminated or revoked sooner. Performed at [facility], [place] [address]  [place], [place] [address]      Time coordinating discharge: 59mins  SIGNED:   Kathie Dike, MD  [facility] 01/06/2019, 8:50 AM   If 7PM-7AM, please contact night-coverage [email or web address]

## 2019-01-06 NOTE — Progress Notes (Signed)
Progress/Discharge note: Writer went over discharge paperwork with script already sent for antibiotics to pharmacy in [place]; patient and her boyfriend both verbalized understanding. I removed IV access and assisted pt getting dressed.

## 2019-01-09 LAB — CULTURE, BLOOD (ROUTINE X 2)
Culture: NO GROWTH
Culture: NO GROWTH
Special Requests: ADEQUATE
Special Requests: ADEQUATE

## 2019-01-18 ENCOUNTER — Other Ambulatory Visit: Payer: 59

## 2019-01-27 ENCOUNTER — Other Ambulatory Visit: Payer: Self-pay

## 2019-01-27 ENCOUNTER — Ambulatory Visit: Payer: Self-pay

## 2019-01-27 ENCOUNTER — Emergency Department
Admission: EM | Admit: 2019-01-27 | Discharge: 2019-01-27 | Disposition: A | Discharge status: Home / Self Care | Payer: 59 | Attending: Student in an Organized Health Care Education/Training Program | Admitting: Student in an Organized Health Care Education/Training Program

## 2019-01-27 ENCOUNTER — Emergency Department: Payer: 59

## 2019-01-27 ENCOUNTER — Encounter: Payer: Self-pay | Admitting: Emergency Medicine

## 2019-01-27 DIAGNOSIS — J069 Acute upper respiratory infection, unspecified: Secondary | ICD-10-CM | POA: Diagnosis not present

## 2019-01-27 DIAGNOSIS — I129 Hypertensive chronic kidney disease with stage 1 through stage 4 chronic kidney disease, or unspecified chronic kidney disease: Secondary | ICD-10-CM | POA: Insufficient documentation

## 2019-01-27 DIAGNOSIS — R0602 Shortness of breath: Secondary | ICD-10-CM | POA: Diagnosis present

## 2019-01-27 DIAGNOSIS — Z79899 Other long term (current) drug therapy: Secondary | ICD-10-CM | POA: Diagnosis not present

## 2019-01-27 DIAGNOSIS — U071 COVID-19: Secondary | ICD-10-CM

## 2019-01-27 DIAGNOSIS — N183 Chronic kidney disease, stage 3 unspecified: Secondary | ICD-10-CM | POA: Diagnosis not present

## 2019-01-27 LAB — BASIC METABOLIC PANEL
Anion gap: 11 (ref 5–15)
BUN: 24 mg/dL — ABNORMAL HIGH (ref 6–20)
CO2: 24 mmol/L (ref 22–32)
Calcium: 9.1 mg/dL (ref 8.9–10.3)
Chloride: 106 mmol/L (ref 98–111)
Creatinine, Ser: 1.11 mg/dL — ABNORMAL HIGH (ref 0.44–1.00)
GFR calc Af Amer: >60 mL/min (ref >60)
GFR calc non Af Amer: 59 mL/min — ABNORMAL LOW (ref >60)
Glucose, Bld: 108 mg/dL — ABNORMAL HIGH (ref 70–99)
Potassium: 3.8 mmol/L (ref 3.5–5.1)
Sodium: 141 mmol/L (ref 135–145)

## 2019-01-27 LAB — CBC
HCT: 42.3 % (ref 36.0–46.0)
Hemoglobin: 13 g/dL (ref 12.0–15.0)
MCH: 27 pg (ref 26.0–34.0)
MCHC: 30.7 g/dL (ref 30.0–36.0)
MCV: 87.9 fL (ref 80.0–100.0)
Platelets: 306 10*3/uL (ref 150–400)
RBC: 4.81 MIL/uL (ref 3.87–5.11)
RDW: 14.6 % (ref 11.5–15.5)
WBC: 8.1 10*3/uL (ref 4.0–10.5)
nRBC: 0 % (ref 0.0–0.2)

## 2019-01-27 LAB — TROPONIN I (HIGH SENSITIVITY)
Troponin I (High Sensitivity): <2 ng/L (ref <18)
Troponin I (High Sensitivity): <2 ng/L (ref <18)

## 2019-01-27 MED ORDER — ONDANSETRON 4 MG PO TBDP: 4.0000 mg | ORAL_TABLET | Freq: Three times a day (TID) | ORAL | 0 refills | Status: DC | PRN

## 2019-01-27 MED ORDER — DEXAMETHASONE 4 MG PO TABS
6.0000 mg | ORAL_TABLET | Freq: Once | ORAL | Status: AC
  Administered 2019-01-27: 6 mg via ORAL
  Filled 2019-01-27: qty 1.5

## 2019-01-27 MED ORDER — DEXAMETHASONE 6 MG PO TABS: 6.0000 mg | ORAL_TABLET | Freq: Every day | ORAL | 0 refills | Status: DC

## 2019-01-27 NOTE — ED Provider Notes (Addendum)
[facility] Emergency Department Provider Note    First MD Initiated Contact with Patient 01/27/19 1845     (approximate)  I have reviewed the triage vital signs and the nursing notes.   HISTORY  Chief Complaint Shortness of Breath    HPI Laura Nolan is a 48 y.o. female below listed past medical history she recently diagnosed with Covid and is having some cough and shortness of breath.  Does have a remote history of endometrial cancer currently in remission.  She is having some nausea but no abdominal pain.  Does not wear home oxygen.  No previous history of lung disease.    Past Medical History:  Diagnosis Date  . Arthralgia   . Chronic renal disease, stage 3, moderately decreased glomerular filtration rate (GFR) between 30-59 mL/min/1.73 square meter   . Depression   . Elevated LFTs   . Endometrial cancer ([place])   . Endometrial cancer ([place])   . GERD (gastroesophageal reflux disease)   . Hypertension   . Morbid obesity ([place])   . Neuropathy due to chemotherapeutic drug ([place])   . Thyroid disease   . TMJ (dislocation of temporomandibular joint)   . Vitamin B12 deficiency   . Vitamin D deficiency    Family History  Problem Relation Age of Onset  . Hypertension Mother   . Diabetes Father   . Hypertension Father    Past Surgical History:  Procedure Laterality Date  . CHOLECYSTECTOMY    . FRACTURE SURGERY    . NEPHRECTOMY Right    Patient Active Problem List   Diagnosis Date Noted  . Essential hypertension 01/05/2019  . CKD (chronic kidney disease), stage III 01/05/2019  . BMI 70 and over, adult ([place]) 01/05/2019  . Cellulitis 01/04/2019  . Pressure injury of skin 12/19/2016  . Cellulitis of left buttock 12/17/2016  . Tonsillar abscess 11/11/2015      Prior to Admission medications   Medication Sig Start Date End Date Taking? Authorizing Provider  acetaminophen (TYLENOL) 500 MG tablet Take 1,000 mg by mouth every 6 (six) hours as  needed for mild pain.    [provider]  dexamethasone (DECADRON) 6 MG tablet Take 1 tablet (6 mg total) by mouth daily. 01/27/19 01/27/20  Merlyn Lot, MD  diphenhydrAMINE (BENADRYL) 25 MG tablet Take 25 mg by mouth every 6 (six) hours as needed for allergies.    [provider]  doxycycline (VIBRAMYCIN) 100 MG capsule Take 1 capsule (100 mg total) by mouth 2 (two) times daily. 01/06/19   Kathie Dike, MD  ondansetron (ZOFRAN ODT) 4 MG disintegrating tablet Take 1 tablet (4 mg total) by mouth every 8 (eight) hours as needed for nausea or vomiting. 01/27/19   Merlyn Lot, MD    Allergies Lisinopril, Macrodantin [nitrofurantoin macrocrystal], and Sulfa antibiotics    Social History Social History   Tobacco Use  . Smoking status: Never Smoker  . Smokeless tobacco: Never Used  Substance Use Topics  . Alcohol use: Yes    Comment: rarely  . Drug use: Never    Review of Systems Patient denies headaches, rhinorrhea, blurry vision, numbness, shortness of breath, chest pain, edema, cough, abdominal pain, nausea, vomiting, diarrhea, dysuria, fevers, rashes or hallucinations unless otherwise stated above in HPI. ____________________________________________   PHYSICAL EXAM:  VITAL SIGNS: Vitals:   01/27/19 1902 01/27/19 1946  BP:    Pulse: 81 79  Resp: 18   Temp:    SpO2: 100% 94%    Constitutional: Alert  and oriented.  Eyes: Conjunctivae are normal.  Head: Atraumatic. Nose: No congestion/rhinnorhea. Mouth/Throat: Mucous membranes are moist.   Neck: No stridor. Painless ROM.  Cardiovascular: Normal rate, regular rhythm. Grossly normal heart sounds.  Good peripheral circulation. Respiratory: Normal respiratory effort.  No retractions. Lungs CTAB. Gastrointestinal: Soft, obese, and nontender. No distention. No abdominal bruits. No CVA tenderness. Genitourinary:  Musculoskeletal: No lower extremity tenderness nor edema.  No joint  effusions. Neurologic:  Normal speech and language. No gross focal neurologic deficits are appreciated. No facial droop Skin:  Skin is warm, dry and intact. No rash noted. Psychiatric: Mood and affect are normal. Speech and behavior are normal.  ____________________________________________   LABS (all labs ordered are listed, but only abnormal results are displayed)  Results for orders placed or performed during the hospital encounter of 01/27/19 (from the past 24 hour(s))  Basic metabolic panel     Status: Abnormal   Collection Time: 01/27/19  2:56 PM  Result Value Ref Range   Sodium 141 135 - 145 mmol/L   Potassium 3.8 3.5 - 5.1 mmol/L   Chloride 106 98 - 111 mmol/L   CO2 24 22 - 32 mmol/L   Glucose, Bld 108 (H) 70 - 99 mg/dL   BUN 24 (H) 6 - 20 mg/dL   Creatinine, Ser 1.11 (H) 0.44 - 1.00 mg/dL   Calcium 9.1 8.9 - 10.3 mg/dL   GFR calc non Af Amer 59 (L) >60 mL/min   GFR calc Af Amer >60 >60 mL/min   Anion gap 11 5 - 15  CBC     Status: None   Collection Time: 01/27/19  2:56 PM  Result Value Ref Range   WBC 8.1 4.0 - 10.5 K/uL   RBC 4.81 3.87 - 5.11 MIL/uL   Hemoglobin 13.0 12.0 - 15.0 g/dL   HCT 42.3 36.0 - 46.0 %   MCV 87.9 80.0 - 100.0 fL   MCH 27.0 26.0 - 34.0 pg   MCHC 30.7 30.0 - 36.0 g/dL   RDW 14.6 11.5 - 15.5 %   Platelets 306 150 - 400 K/uL   nRBC 0.0 0.0 - 0.2 %  Troponin I (High Sensitivity)     Status: None   Collection Time: 01/27/19  2:56 PM  Result Value Ref Range   Troponin I (High Sensitivity) <2 <18 ng/L  Troponin I (High Sensitivity)     Status: None   Collection Time: 01/27/19  6:10 PM  Result Value Ref Range   Troponin I (High Sensitivity) <2 <18 ng/L   ____________________________________________  EKG My review and personal interpretation at Time: 14:59   Indication: sob  Rate: 70  Rhythm: sinus Axis: normal Other: normal intervals, no stemi ____________________________________________  RADIOLOGY  I personally reviewed all  radiographic images ordered to evaluate for the above acute complaints and reviewed radiology reports and findings.  These findings were personally discussed with the patient.  Please see medical record for radiology report.  ____________________________________________   PROCEDURES  Procedure(s) performed:  Procedures    Critical Care performed: no ____________________________________________   INITIAL IMPRESSION / ASSESSMENT AND PLAN / ED COURSE  Pertinent labs & imaging results that were available during my care of the patient were reviewed by me and considered in my medical decision making (see chart for details).   DDX: pna, ptx, chf, sepsis, covid, bronchitis, asthma, pe  Laura Nolan is a 48 y.o. who presents to the ED with symptoms as described above.  Patient nontoxic-appearing seems fairly minimally symptomatic  and does have some persistent cough.  Chest x-ray without any focal infiltrates blood work is reassuring.  Have a low suspicion for PE given her lack of hypoxia or tachycardia.  Observed in the ER without any hypoxia. The patient will be placed on continuous pulse oximetry and telemetry for monitoring.  Laboratory evaluation will be sent to evaluate for the above complaints.     Clinical Course as of Jan 27 2003  Thu Jan 27, 2019  1946 Patient has been observed in the ER.  No hypoxia.  Symptoms seem to be quite minimal.  Given her comorbidities will refer to [facility] infusion center but I do not see any indication for hospitalization at this time.  She stable and appropriate for outpatient management discussed signs and symptoms for which she should return to the ER.   [PR]    Clinical Course User Index [PR] Merlyn Lot, MD    The patient was evaluated in Emergency Department today for the symptoms described in the history of present illness. He/she was evaluated in the context of the global COVID-19 pandemic, which necessitated consideration that the patient might  be at risk for infection with the SARS-CoV-2 virus that causes COVID-19. Institutional protocols and algorithms that pertain to the evaluation of patients at risk for COVID-19 are in a state of rapid change based on information released by regulatory bodies including the CDC and federal and state organizations. These policies and algorithms were followed during the patient's care in the ED.  As part of my medical decision making, I reviewed the following data within the [facility] notes reviewed and incorporated, Labs reviewed, notes from prior ED visits and [place] Controlled Substance Database   ____________________________________________   FINAL CLINICAL IMPRESSION(S) / ED DIAGNOSES  Final diagnoses:  Upper respiratory tract infection, unspecified type  COVID-19 virus infection      NEW MEDICATIONS STARTED DURING THIS VISIT:  New Prescriptions   DEXAMETHASONE (DECADRON) 6 MG TABLET    Take 1 tablet (6 mg total) by mouth daily.   ONDANSETRON (ZOFRAN ODT) 4 MG DISINTEGRATING TABLET    Take 1 tablet (4 mg total) by mouth every 8 (eight) hours as needed for nausea or vomiting.     Note:  This document was prepared using Dragon voice recognition software and may include unintentional dictation errors.    Merlyn Lot, MD 01/27/19 1950    Merlyn Lot, MD 01/27/19 2004

## 2019-01-27 NOTE — ED Notes (Signed)
PT waiting for ride home

## 2019-01-27 NOTE — ED Notes (Signed)
See triage note  Presents with increased SOB since last pm    Recently dx'd with COVID about 8 days ago    Also having some tightness to chest  Afebrile on arrival

## 2019-01-27 NOTE — Telephone Encounter (Signed)
Returned call to patient who had called with a covid-19 question.  She states she has a tele visit and was sent to ER and is there now waiting to be seen. She states she got her answer to her question.  Reason for Disposition . Caller has already spoken with the PCP and has no further questions.    Caller had tele visit Sent to ER.  Answer Assessment - Initial Assessment Questions 1. REASON FOR CALL or QUESTION: "What is your reason for calling today?" or "How can I best help you?" or "What question do you have that I can help answer?"     Returned call to patient who had called with a covid-19 question.  She states she has a tele visit and was sent to ER and is there now waiting to be seen. She states she got her answer to her question.  Protocols used: NO CONTACT OR DUPLICATE CONTACT CALL-A-AH, INFORMATION ONLY CALL - NO TRIAGE-A-AH

## 2019-01-27 NOTE — ED Notes (Signed)
Pt placed on O2 monitor per EDP. Pt states she still has SOB but not as bad as when she came in.  Pt conversing freely. No apparent SOB. See VS

## 2019-01-27 NOTE — ED Triage Notes (Signed)
Patient presents to the ED for increased shortness of breath since last night.  Patient is also complaining of tightness in her chest radiating into her back.  Patient was diagnosed with covid19 on Dec. 23rd.

## 2019-01-28 ENCOUNTER — Telehealth: Payer: Self-pay | Admitting: Unknown Physician Specialty

## 2019-01-28 NOTE — Telephone Encounter (Signed)
Called to discuss with patient about Covid symptoms and the use of bamlanivimab, a monoclonal antibody infusion for those with mild to moderate Covid symptoms and at a high risk of hospitalization.  Pt is not qualified for this infusion due to symptoms greater than 14 days

## 2019-09-11 ENCOUNTER — Ambulatory Visit: Payer: Self-pay

## 2019-09-30 ENCOUNTER — Other Ambulatory Visit: Payer: Self-pay | Admitting: Family Medicine

## 2019-09-30 DIAGNOSIS — Z1231 Encounter for screening mammogram for malignant neoplasm of breast: Secondary | ICD-10-CM

## 2019-11-22 ENCOUNTER — Ambulatory Visit
Admission: RE | Admit: 2019-11-22 | Discharge: 2019-11-22 | Disposition: A | Discharge status: Home / Self Care | Payer: Medicaid Other | Source: Ambulatory Visit | Attending: Family Medicine | Admitting: Family Medicine

## 2019-11-22 ENCOUNTER — Ambulatory Visit (INDEPENDENT_AMBULATORY_CARE_PROVIDER_SITE_OTHER): Payer: Medicaid Other

## 2019-11-22 ENCOUNTER — Other Ambulatory Visit: Payer: Self-pay

## 2019-11-22 VITALS — BP 140/78 | HR 90 | Temp 98.8°F | Resp 18 | Ht 66.0 in | Wt >= 6400 oz

## 2019-11-22 DIAGNOSIS — M79672 Pain in left foot: Secondary | ICD-10-CM

## 2019-11-22 MED ORDER — HYDROCODONE-ACETAMINOPHEN 5-325 MG PO TABS: 1.0000 | ORAL_TABLET | Freq: Three times a day (TID) | ORAL | 0 refills | Status: AC | PRN

## 2019-11-22 NOTE — ED Provider Notes (Signed)
[facility]    CSN: [id number] Arrival date & time: 11/22/19  0947  History   Chief Complaint Chief Complaint  Patient presents with  . Foot Pain   HPI  49 year old female presents with left foot pain.  Started a few days ago.  No known fall, trauma, injury.  Pain is located on the dorsum of the foot.  Pain is 10/10 in severity.  No relieving factors.  Patient states that she has extreme difficulty ambulating especially in the setting of her pain.  No bruising.  No other associated symptoms.  No other complaints.  Past Medical History:  Diagnosis Date  . Arthralgia   . Chronic renal disease, stage 3, moderately decreased glomerular filtration rate (GFR) between 30-59 mL/min/1.73 square meter (HCC)   . Depression   . Elevated LFTs   . Endometrial cancer ([place])   . Endometrial cancer ([place])   . GERD (gastroesophageal reflux disease)   . Hypertension   . Morbid obesity ([place])   . Neuropathy due to chemotherapeutic drug ([place])   . Thyroid disease   . TMJ (dislocation of temporomandibular joint)   . Vitamin B12 deficiency   . Vitamin D deficiency     Patient Active Problem List   Diagnosis Date Noted  . Essential hypertension 01/05/2019  . CKD (chronic kidney disease), stage III ([place]) 01/05/2019  . BMI 70 and over, adult ([place]) 01/05/2019  . Cellulitis 01/04/2019  . Pressure injury of skin 12/19/2016  . Cellulitis of left buttock 12/17/2016  . Tonsillar abscess 11/11/2015    Past Surgical History:  Procedure Laterality Date  . CHOLECYSTECTOMY    . FRACTURE SURGERY    . NEPHRECTOMY Right     OB History   No obstetric history on file.      Home Medications    Prior to Admission medications   Medication Sig Start Date End Date Taking? Authorizing Provider  buPROPion (WELLBUTRIN XL) 300 MG 24 hr tablet Take by mouth. 09/29/19 09/28/20 Yes [provider]  busPIRone (BUSPAR) 5 MG tablet Take by mouth. 09/29/19 09/28/20 Yes [provider]    levothyroxine (SYNTHROID) 75 MCG tablet Take by mouth. 09/29/19  Yes [provider]  losartan (COZAAR) 25 MG tablet Take by mouth. 09/29/19 09/28/20 Yes [provider]  HYDROcodone-acetaminophen (NORCO/VICODIN) 5-325 MG tablet Take 1 tablet by mouth every 8 (eight) hours as needed for moderate pain or severe pain. 11/22/19   Coral Spikes, DO  diphenhydrAMINE (BENADRYL) 25 MG tablet Take 25 mg by mouth every 6 (six) hours as needed for allergies.  11/22/19  [provider]    Family History Family History  Problem Relation Age of Onset  . Hypertension Mother   . Diabetes Father   . Hypertension Father     Social History Social History   Tobacco Use  . Smoking status: Never Smoker  . Smokeless tobacco: Never Used  Vaping Use  . Vaping Use: Never used  Substance Use Topics  . Alcohol use: Yes    Comment: rarely  . Drug use: Never     Allergies   Lisinopril, Macrodantin [nitrofurantoin macrocrystal], and Sulfa antibiotics   Review of Systems Review of Systems  Musculoskeletal:       Left foot pain.   Physical Exam Triage Vital Signs ED Triage Vitals  Enc Vitals Group     BP 11/22/19 1008 140/78     Pulse Rate 11/22/19 1008 90     Resp 11/22/19 1008 18  Temp 11/22/19 1008 98.8 F (37.1 C)     Temp Source 11/22/19 1008 Oral     SpO2 11/22/19 1008 97 %     Weight 11/22/19 1005 (!) 550 lb (249.5 kg)     Height 11/22/19 1005 5\' 6"  (1.676 m)     Head Circumference --      Peak Flow --      Pain Score 11/22/19 1005 10     Pain Loc --      Pain Edu? --      Excl. in [place]? --    Updated Vital Signs BP 140/78 (BP Location: Right Arm)   Pulse 90   Temp 98.8 F (37.1 C) (Oral)   Resp 18   Ht 5\' 6"  (1.676 m)   Wt (!) 249.5 kg   SpO2 97%   BMI 88.77 kg/m   Visual Acuity Right Eye Distance:   Left Eye Distance:   Bilateral Distance:    Right Eye Near:   Left Eye Near:    Bilateral Near:     Physical Exam Constitutional:       Appearance: Normal appearance. She is obese.  HENT:     Head: Normocephalic and atraumatic.  Eyes:     General:        Right eye: No discharge.        Left eye: No discharge.     Conjunctiva/sclera: Conjunctivae normal.  Pulmonary:     Effort: Pulmonary effort is normal. No respiratory distress.  Musculoskeletal:       Feet:  Feet:     Comments: Tenderness at the labelled location. Mild swelling. Neurological:     Mental Status: She is alert.  Psychiatric:        Mood and Affect: Mood normal.        Behavior: Behavior normal.    UC Treatments / Results  Labs (all labs ordered are listed, but only abnormal results are displayed) Labs Reviewed - No data to display  EKG   Radiology DG Foot Complete Left  Result Date: 11/22/2019 CLINICAL DATA:  Pain.  Reported history of gout EXAM: LEFT FOOT - COMPLETE 3+ VIEW COMPARISON:  None. FINDINGS: Frontal, oblique, and lateral views were obtained. No appreciable fracture or dislocation. There is hallux valgus deformity at the first MTP joint with bony overgrowth of the distal first metatarsal. There is no appreciable joint space narrowing or erosion. There are inferior and posterior calcaneal spurs. There is somewhat generalized soft tissue swelling. IMPRESSION: Somewhat generalized soft tissue swelling. Hallux valgus deformity first MTP joint with bony overgrowth of the distal first metatarsal. No appreciable joint space narrowing or erosion. No fracture or dislocation. There are calcaneal spurs. Electronically Signed   By: Lowella Grip III M.D.   On: 11/22/2019 11:20    Procedures Procedures (including critical care time)  Medications Ordered in UC Medications - No data to display  Initial Impression / Assessment and Plan / UC Course  I have reviewed the triage vital signs and the nursing notes.  Pertinent labs & imaging results that were available during my care of the patient were reviewed by me and considered in my medical  decision making (see chart for details).    49 year old female presents with left foot pain.  X-ray obtained and independently reviewed by me.  Interpretation: Hallux valgus noted.  No appreciable fracture.  Advised ice and elevation.  Short supply of pain medication given.  See below.  [place] controlled substance database reviewed.  Final Clinical Impressions(s) / UC Diagnoses   Final diagnoses:  Left foot pain     Discharge Instructions     Medication as needed.  No fracture.  Try and elevate the foot and ice it.  Take care  Dr. Lacinda Axon    ED Prescriptions    Medication Sig Dispense Auth. Provider   HYDROcodone-acetaminophen (NORCO/VICODIN) 5-325 MG tablet Take 1 tablet by mouth every 8 (eight) hours as needed for moderate pain or severe pain. 10 tablet Thersa Salt G, DO     I have reviewed the PDMP during this encounter.   Coral Spikes, [place] 11/22/19 1203

## 2019-11-22 NOTE — Discharge Instructions (Signed)
Medication as needed.  No fracture.  Try and elevate the foot and ice it.  Take care  Dr. Lacinda Axon

## 2019-11-22 NOTE — ED Triage Notes (Signed)
Patient c/o left foot pain that started a few days ago. She states she has a history of gout but also is concerned for a fracture.

## 2022-01-21 IMAGING — DX DG FOOT COMPLETE 3+V*L*
3 series · 4 of 4 positions shown · non-contrast
Comparison: None.

CLINICAL DATA: Pain.  Reported history of gout

EXAM:
LEFT FOOT - COMPLETE 3+ VIEW

[foot ap]
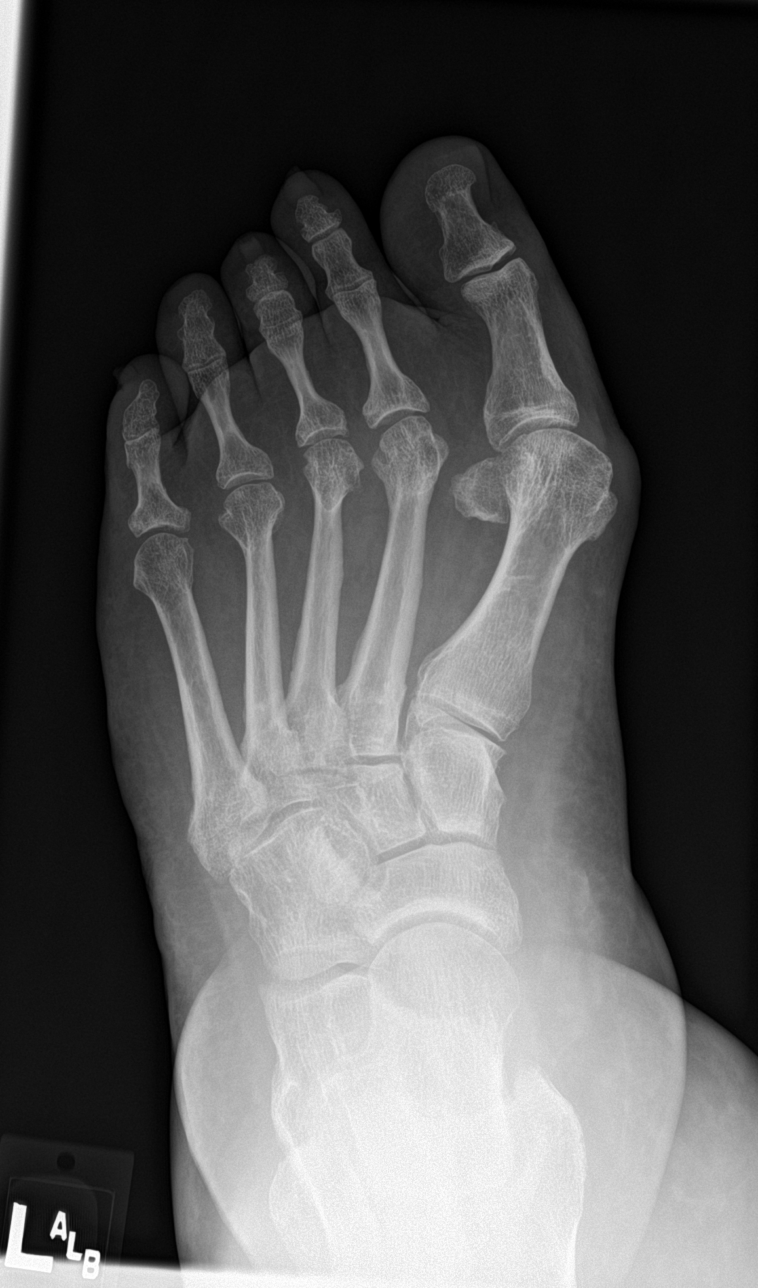

[foot obl]
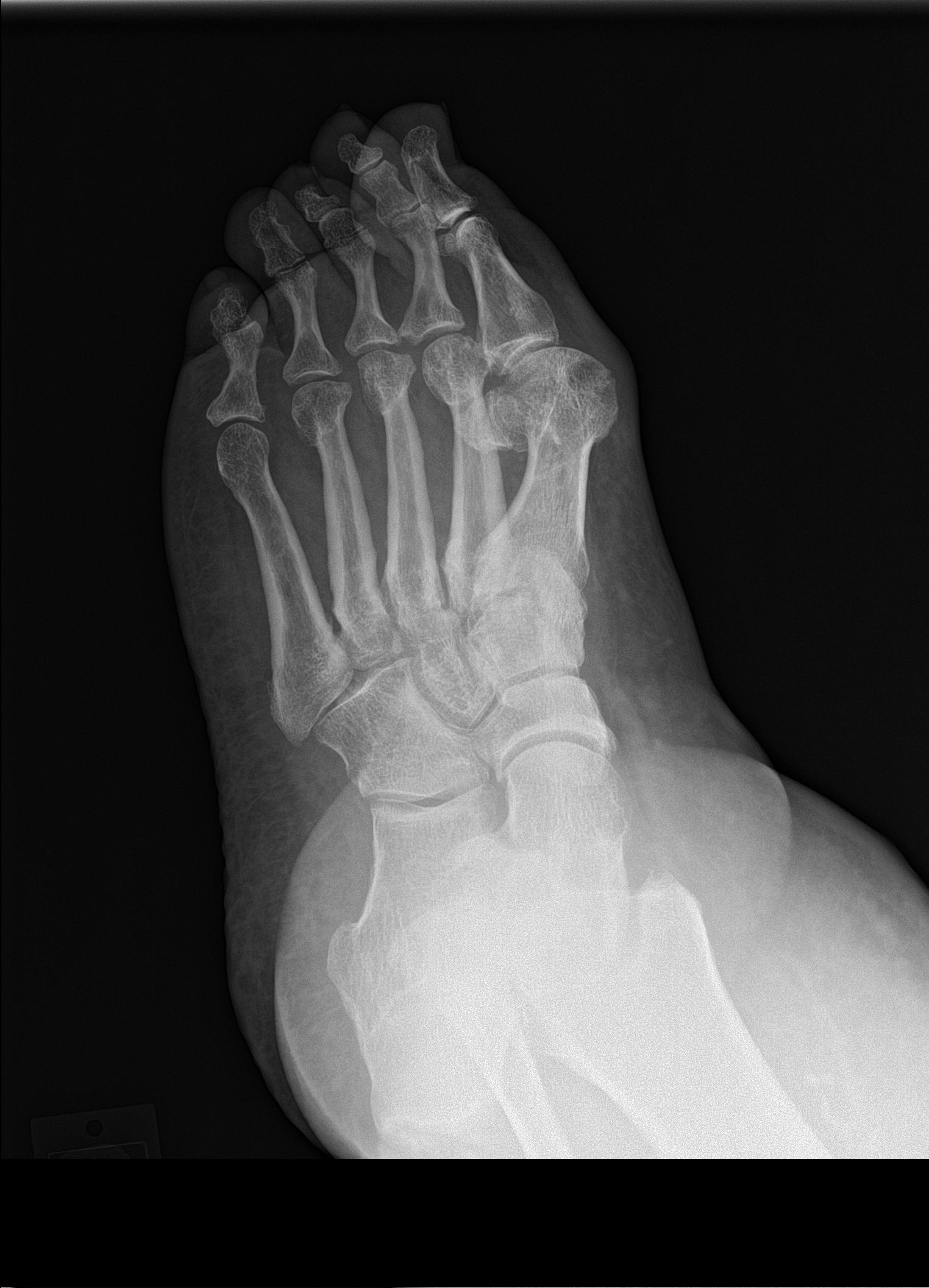

[Series 3: foot lat · 0.14mm/px · 2 of 2 slices shown]
[im 1/2]
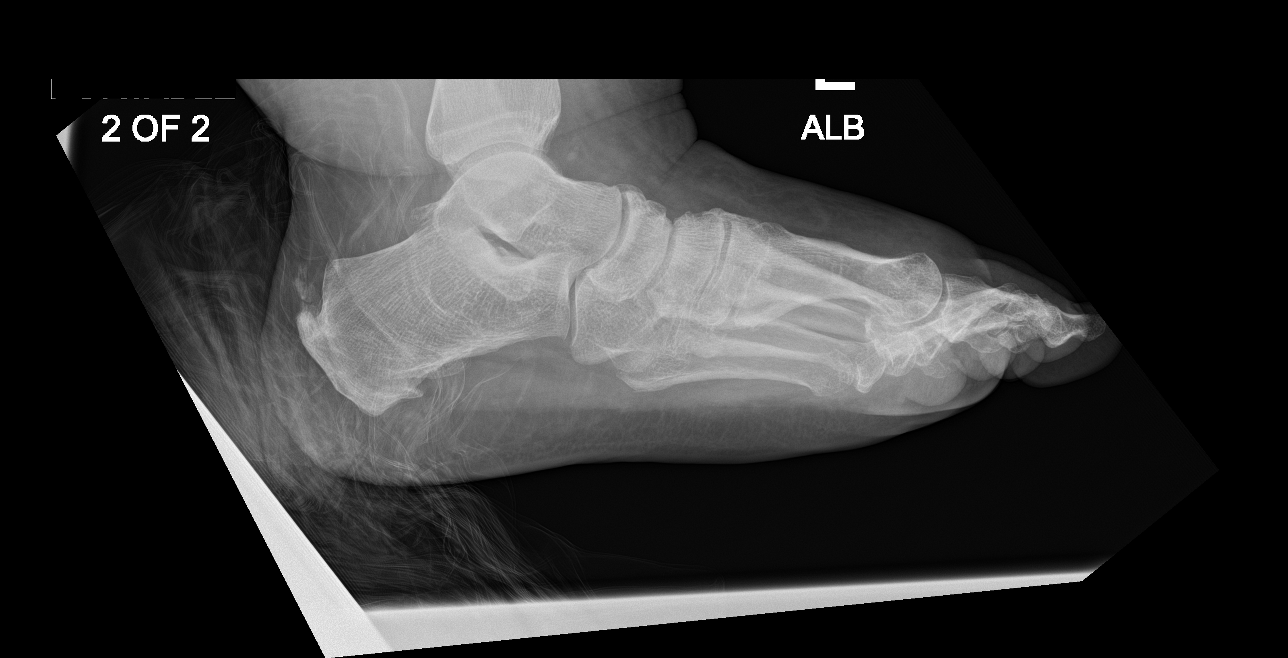
[im 2/2]
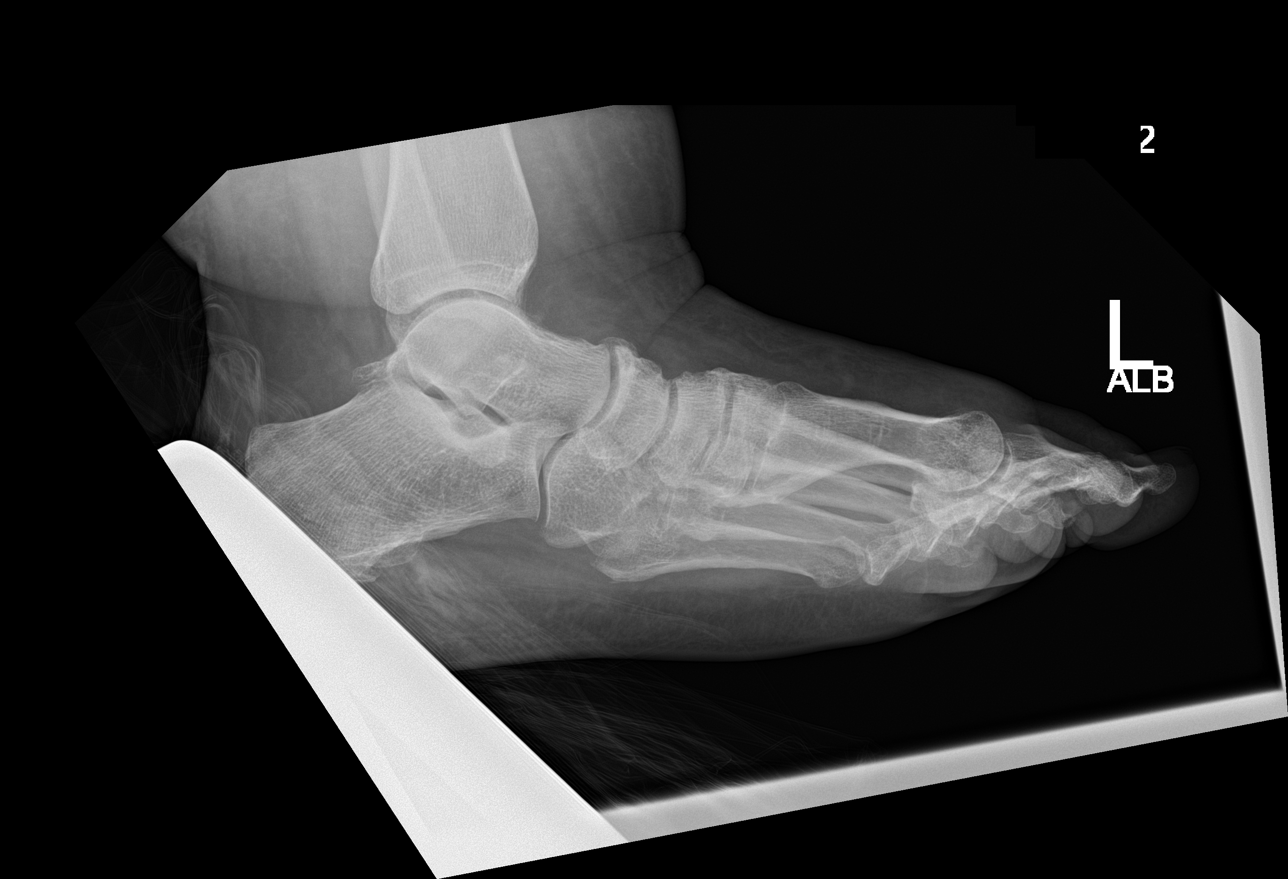

[4 of 4 positions shown; findings below may reference images not displayed]

FINDINGS: Frontal, oblique, and lateral views were obtained. No appreciable
fracture or dislocation. There is hallux valgus deformity at the
first MTP joint with bony overgrowth of the distal first metatarsal.
There is no appreciable joint space narrowing or erosion. There are
inferior and posterior calcaneal spurs. There is somewhat
generalized soft tissue swelling.
IMPRESSION: Somewhat generalized soft tissue swelling. Hallux valgus deformity
first MTP joint with bony overgrowth of the distal first metatarsal.
No appreciable joint space narrowing or erosion. No fracture or
dislocation. There are calcaneal spurs.

## 2022-02-09 ENCOUNTER — Emergency Department
Admission: EM | Admit: 2022-02-09 | Discharge: 2022-02-09 | Disposition: A | Discharge status: Home / Self Care | Payer: 59 | Attending: Emergency Medicine | Admitting: Emergency Medicine

## 2022-02-09 DIAGNOSIS — R42 Dizziness and giddiness: Secondary | ICD-10-CM

## 2022-02-09 DIAGNOSIS — E860 Dehydration: Secondary | ICD-10-CM | POA: Insufficient documentation

## 2022-02-09 DIAGNOSIS — I10 Essential (primary) hypertension: Secondary | ICD-10-CM | POA: Insufficient documentation

## 2022-02-09 DIAGNOSIS — N289 Disorder of kidney and ureter, unspecified: Secondary | ICD-10-CM | POA: Insufficient documentation

## 2022-02-09 DIAGNOSIS — Z1152 Encounter for screening for COVID-19: Secondary | ICD-10-CM | POA: Diagnosis not present

## 2022-02-09 DIAGNOSIS — N179 Acute kidney failure, unspecified: Secondary | ICD-10-CM

## 2022-02-09 LAB — RESP PANEL BY RT-PCR (RSV, FLU A&B, COVID)  RVPGX2
Influenza A by PCR: NEGATIVE
Influenza B by PCR: NEGATIVE
Resp Syncytial Virus by PCR: NEGATIVE
SARS Coronavirus 2 by RT PCR: NEGATIVE

## 2022-02-09 LAB — URINALYSIS, COMPLETE (UACMP) WITH MICROSCOPIC
Bilirubin Urine: NEGATIVE
Glucose, UA: NEGATIVE mg/dL
Hgb urine dipstick: NEGATIVE
Ketones, ur: NEGATIVE mg/dL
Leukocytes,Ua: NEGATIVE
Nitrite: NEGATIVE
Protein, ur: NEGATIVE mg/dL
Specific Gravity, Urine: 1.009 (ref 1.005–1.030)
pH: 5 (ref 5.0–8.0)

## 2022-02-09 LAB — CBC
HCT: 41.4 % (ref 36.0–46.0)
Hemoglobin: 13.6 g/dL (ref 12.0–15.0)
MCH: 30 pg (ref 26.0–34.0)
MCHC: 32.9 g/dL (ref 30.0–36.0)
MCV: 91.4 fL (ref 80.0–100.0)
Platelets: 273 10*3/uL (ref 150–400)
RBC: 4.53 MIL/uL (ref 3.87–5.11)
RDW: 15.3 % (ref 11.5–15.5)
WBC: 9.1 10*3/uL (ref 4.0–10.5)
nRBC: 0 % (ref 0.0–0.2)

## 2022-02-09 LAB — COMPREHENSIVE METABOLIC PANEL
ALT: 34 U/L (ref 0–44)
AST: 43 U/L — ABNORMAL HIGH (ref 15–41)
Albumin: 3 g/dL — ABNORMAL LOW (ref 3.5–5.0)
Alkaline Phosphatase: 64 U/L (ref 38–126)
Anion gap: 10 (ref 5–15)
BUN: 29 mg/dL — ABNORMAL HIGH (ref 6–20)
CO2: 19 mmol/L — ABNORMAL LOW (ref 22–32)
Calcium: 8.2 mg/dL — ABNORMAL LOW (ref 8.9–10.3)
Chloride: 102 mmol/L (ref 98–111)
Creatinine, Ser: 2.29 mg/dL — ABNORMAL HIGH (ref 0.44–1.00)
GFR, Estimated: 25 mL/min — ABNORMAL LOW (ref >60)
Glucose, Bld: 151 mg/dL — ABNORMAL HIGH (ref 70–99)
Potassium: 4 mmol/L (ref 3.5–5.1)
Sodium: 131 mmol/L — ABNORMAL LOW (ref 135–145)
Total Bilirubin: 2.4 mg/dL — ABNORMAL HIGH (ref 0.3–1.2)
Total Protein: 7.5 g/dL (ref 6.5–8.1)

## 2022-02-09 LAB — TROPONIN I (HIGH SENSITIVITY): Troponin I (High Sensitivity): 4 ng/L (ref <18)

## 2022-02-09 MED ORDER — SODIUM CHLORIDE 0.9 % IV BOLUS
1000.0000 mL | Freq: Once | INTRAVENOUS | Status: AC
  Administered 2022-02-09: 1000 mL via INTRAVENOUS

## 2022-02-09 NOTE — Discharge Instructions (Addendum)
As we discussed your workup today shows renal insufficiency/decreased kidney function.  This very likely could be related to dehydration.  You have received IV fluids in the emergency department.  Please continue to increase your oral intake of fluids over the next several days and follow-up with your doctor in 2 to 3 days to have your labs including kidney function rechecked.  Return to the emergency department if you have any further episodes of dizziness/lightheadedness, or for any other symptom personally concerning to yourself such as chest pain trouble breathing.

## 2022-02-09 NOTE — ED Provider Notes (Signed)
[facility] Provider Note    Event Date/Time   First MD Initiated Contact with Patient 02/09/22 1946     (approximate)  History   Chief Complaint: Dizziness  HPI  Laura Nolan is a 52 y.o. female with a past medical history of gastric reflux, depression, hypertension, morbid obesity presents to the emergency department for lightheadedness/dizziness.  According to the patient earlier today they tried to get the patient up to the bedside commode for bowel movement.  Patient states she immediately became dizzy and lightheaded and states she needed to sit back down.  Tonight they tried to get the patient up to the bedside commode once again and she became dizzy so she came to the emergency department for evaluation.  Patient denies any actual syncopal event.  Denies any chest pain shortness of breath or abdominal pain.  Patient states she is currently on antibiotic for recent urinary tract infection but no longer has any urinary symptoms.  Patient states she did have a subjective fever yesterday with the day before but none today.  Afebrile currently at 98 degrees.  Physical Exam   Triage Vital Signs: ED Triage Vitals  Enc Vitals Group     BP 02/09/22 1942 125/67     Pulse Rate 02/09/22 1942 100     Resp 02/09/22 1942 (!) 22     Temp 02/09/22 1942 98 F (36.7 C)     Temp Source 02/09/22 1942 Oral     SpO2 02/09/22 1942 99 %     Weight 02/09/22 1946 (!) 550 lb (249.5 kg)     Height 02/09/22 1946 '5\' 6"'$  (1.676 m)     Head Circumference --      Peak Flow --      Pain Score 02/09/22 1946 0     Pain Loc --      Pain Edu? --      Excl. in [place]? --     Most recent vital signs: Vitals:   02/09/22 1942  BP: 125/67  Pulse: 100  Resp: (!) 22  Temp: 98 F (36.7 C)  SpO2: 99%    General: Awake, no distress.  CV:  Good peripheral perfusion.  Regular rate and rhythm  Resp:  Normal effort.  Equal breath sounds bilaterally.  Abd:  No distention.  Soft, nontender.   No rebound or guarding.  Morbid obesity.   ED Results / Procedures / Treatments   EKG  EKG viewed and interpreted by myself shows a normal sinus rhythm at 97 bpm with a narrow QRS, normal axis, normal intervals, no concerning ST changes.  MEDICATIONS ORDERED IN ED: Medications  sodium chloride 0.9 % bolus 1,000 mL (has no administration in time range)     IMPRESSION / MDM / ASSESSMENT AND PLAN / ED COURSE  I reviewed the triage vital signs and the nursing notes.  Patient's presentation is most consistent with acute presentation with potential threat to life or bodily function.  Patient presents emergency department for near syncope/dizziness ongoing throughout the day today.  Signs are reassuring.  Morbidly obese which limits physical exam.  No apparent tenderness to palpation of the abdomen.  We will check labs, urinalysis, COVID/flu/RSV.  Will also check cardiac enzyme and EKG and continue to closely monitor.  We will IV hydrate while awaiting results.  Patient agreeable plan of care.  Differentials quite broad would include dehydration, infectious etiologies such as COVID/flu/RSV or UTI, less likely ACS.  Patient's workup has resulted showing a  negative troponin.  Urinalysis does not appear to show any infection.  COVID/flu/RSV is negative.  Patient's chemistry does show renal insufficiency with a creatinine of 2.29.  I reviewed the patient's care everywhere labs she had chemistry from October showing a creatinine of 1.3.  This very likely could be the cause of the patient's lightheadedness and dizziness upon standing, could be related to dehydration.  Remainder the lab work including CBC shows no concerning findings.  Given the acute renal insufficiency I did offer admission to the hospital.  Patient states he will strongly prefer to go home if possible.  Given that acute renal dysfunction could very well be from dehydration we will dose a second liter of IV fluids.  I discussed with the  patient that she would need to have her blood work rechecked Tuesday or Wednesday by her doctor to ensure that the kidney function is rebounding.  Patient agreeable to plan of care.  Discussed return precautions if the dizziness/lightheadedness returns patient is to return to the emergency department.  FINAL CLINICAL IMPRESSION(S) / ED DIAGNOSES   Dizziness Acute renal insufficiency Dehydration   Note:  This document was prepared using Dragon voice recognition software and may include unintentional dictation errors.   Harvest Dark, MD 02/09/22 2149

## 2022-02-09 NOTE — ED Triage Notes (Signed)
Pt from home CC feeling "like I am going to pass out when I'm having a bowel movement." X2 times  Pt denies Hx of these Sx.

## 2022-04-29 ENCOUNTER — Ambulatory Visit: Payer: 59 | Admitting: Occupational Therapy
